# Patient Record
Sex: Female | Born: 1965
Health system: Southern US, Community
[De-identification: ages and names within clinical notes are randomized; demographics above are authoritative.]

## PROBLEM LIST (undated history)

## (undated) DIAGNOSIS — H269 Unspecified cataract: Secondary | ICD-10-CM

## (undated) DIAGNOSIS — Z808 Family history of malignant neoplasm of other organs or systems: Secondary | ICD-10-CM

## (undated) DIAGNOSIS — Z8051 Family history of malignant neoplasm of kidney: Secondary | ICD-10-CM

## (undated) DIAGNOSIS — Z8052 Family history of malignant neoplasm of bladder: Secondary | ICD-10-CM

## (undated) DIAGNOSIS — R51 Headache: Secondary | ICD-10-CM

## (undated) DIAGNOSIS — M1990 Unspecified osteoarthritis, unspecified site: Secondary | ICD-10-CM

## (undated) DIAGNOSIS — D649 Anemia, unspecified: Secondary | ICD-10-CM

## (undated) DIAGNOSIS — R011 Cardiac murmur, unspecified: Secondary | ICD-10-CM

## (undated) DIAGNOSIS — Z806 Family history of leukemia: Secondary | ICD-10-CM

## (undated) DIAGNOSIS — Z803 Family history of malignant neoplasm of breast: Secondary | ICD-10-CM

## (undated) HISTORY — DX: Family history of malignant neoplasm of kidney: Z80.51

## (undated) HISTORY — DX: Family history of malignant neoplasm of breast: Z80.3

## (undated) HISTORY — PX: MOUTH SURGERY: SHX715

## (undated) HISTORY — DX: Cardiac murmur, unspecified: R01.1

## (undated) HISTORY — PX: TOTAL ABDOMINAL HYSTERECTOMY: SHX209

## (undated) HISTORY — DX: Family history of leukemia: Z80.6

## (undated) HISTORY — DX: Unspecified osteoarthritis, unspecified site: M19.90

## (undated) HISTORY — DX: Family history of malignant neoplasm of other organs or systems: Z80.8

## (undated) HISTORY — DX: Unspecified cataract: H26.9

## (undated) HISTORY — DX: Family history of malignant neoplasm of bladder: Z80.52

## (undated) HISTORY — PX: COLON SURGERY: SHX602

---

## 1995-10-15 HISTORY — PX: COLON RESECTION: SHX5231

## 2006-07-16 ENCOUNTER — Other Ambulatory Visit: Admission: RE | Admit: 2006-07-16 | Discharge: 2006-07-16 | Payer: Self-pay | Admitting: Family Medicine

## 2008-04-29 ENCOUNTER — Ambulatory Visit: Payer: Self-pay | Admitting: Sports Medicine

## 2008-04-29 DIAGNOSIS — M79609 Pain in unspecified limb: Secondary | ICD-10-CM

## 2008-04-29 DIAGNOSIS — M722 Plantar fascial fibromatosis: Secondary | ICD-10-CM

## 2008-06-10 ENCOUNTER — Telehealth: Payer: Self-pay | Admitting: Sports Medicine

## 2008-06-13 ENCOUNTER — Other Ambulatory Visit: Admission: RE | Admit: 2008-06-13 | Discharge: 2008-06-13 | Payer: Self-pay | Admitting: Family Medicine

## 2008-07-28 ENCOUNTER — Ambulatory Visit: Payer: Self-pay

## 2008-08-03 ENCOUNTER — Ambulatory Visit: Payer: Self-pay | Admitting: Sports Medicine

## 2008-08-03 DIAGNOSIS — M25569 Pain in unspecified knee: Secondary | ICD-10-CM

## 2008-08-03 DIAGNOSIS — M7742 Metatarsalgia, left foot: Secondary | ICD-10-CM

## 2008-09-13 ENCOUNTER — Ambulatory Visit: Payer: Self-pay | Admitting: Sports Medicine

## 2008-11-15 ENCOUNTER — Encounter: Admission: RE | Admit: 2008-11-15 | Discharge: 2008-11-15 | Payer: Self-pay | Admitting: Family Medicine

## 2009-04-10 ENCOUNTER — Emergency Department (HOSPITAL_COMMUNITY): Admission: EM | Admit: 2009-04-10 | Discharge: 2009-04-10 | Payer: Self-pay | Admitting: Family Medicine

## 2013-02-11 ENCOUNTER — Other Ambulatory Visit (HOSPITAL_COMMUNITY)
Admission: RE | Admit: 2013-02-11 | Discharge: 2013-02-11 | Disposition: A | Discharge status: Home / Self Care | Payer: 59 | Source: Ambulatory Visit | Attending: Internal Medicine | Admitting: Internal Medicine

## 2013-02-11 ENCOUNTER — Other Ambulatory Visit: Payer: Self-pay | Admitting: Internal Medicine

## 2013-02-11 DIAGNOSIS — Z01419 Encounter for gynecological examination (general) (routine) without abnormal findings: Secondary | ICD-10-CM | POA: Insufficient documentation

## 2013-02-11 DIAGNOSIS — N852 Hypertrophy of uterus: Secondary | ICD-10-CM

## 2013-02-11 DIAGNOSIS — Z1151 Encounter for screening for human papillomavirus (HPV): Secondary | ICD-10-CM | POA: Insufficient documentation

## 2013-02-12 ENCOUNTER — Ambulatory Visit
Admission: RE | Admit: 2013-02-12 | Discharge: 2013-02-12 | Disposition: A | Discharge status: Home / Self Care | Payer: 59 | Source: Ambulatory Visit | Attending: Internal Medicine | Admitting: Internal Medicine

## 2013-02-12 DIAGNOSIS — N852 Hypertrophy of uterus: Secondary | ICD-10-CM

## 2013-04-13 NOTE — H&P (Signed)
Denise Smith  DICTATION # [id number] CSN# [id number]   Meriel Pica, MD 04/13/2013 2:04 PM

## 2013-04-14 NOTE — H&P (Signed)
NAMEJOEANN, Smith             ACCOUNT NO.:  [id number]  MEDICAL RECORD NO.:  [id number]  LOCATION:                                 FACILITY:  PHYSICIAN:  Duke Salvia. Marcelle Overlie, M.D.DATE OF BIRTH:  08/05/1966  DATE OF ADMISSION:  05/06/2013 DATE OF DISCHARGE:                             HISTORY & PHYSICAL   CHIEF COMPLAINT:  Pelvic pain, leiomyoma.  HISTORY OF PRESENT ILLNESS:  A 47 year old G0, P0, this patient is getting engaged to be married soon, was seen by Dr.  Merri Brunette for general medical exam in May of this year, felt to have uterine enlargement so ultrasound was ordered since she had complaints of pelvic pain and mild anemia.  Ultrasound at [facility] showed a uterus that was enlarged with multiple fibroids, including a 5.8-cm midline fundal fibroid and an exophytic right subserosal fibroid that was 6 cm.  Adnexa appeared to be unremarkable.  No free fluid was noted. Her hemoglobin was 11.8 on his screening.  My initial evaluation due to problems she has had with menorrhagia and pelvic pain along with increased pressure.  She requested definitive hysterectomy.  Does not plan to ever get pregnant.  The procedure of hysterectomy including specific risks related to bleeding, infection, transfusion, adjacent organ injury, wound infection, phlebitis, along with her expected recovery time discussed which she understands and accepts.  Of note, she had a prior midline incision for laparotomy with low anterior resection with primary reanastomosis and rectal plasty in 1995 that was done in [place].  CURRENT MEDICATIONS:  Iron, Imitrex p.r.n.  PAST SURGICAL HISTORY:  Low anterior resection with rectal plasty at [facility] in 1995.  REVIEW OF SYSTEMS:  Significant for history of migraine, mild anemia.  FAMILY HISTORY:  Significant for migraine headaches.  Father had an MI and ulcer disease.  Sister and mother both with a history of  anemia. Mother with osteoporosis.  Father with diverticulosis and arthritis, along with diabetes and history of kidney cancer in her father.  Also mother with history of breast cancer.  SOCIAL HISTORY:  Denies alcohol, tobacco, or drug use.  She is married, going on her honeymoon prior to surgery.  PHYSICAL EXAMINATION:  VITAL SIGNS:  Temperature 98.2, blood pressure 110/80. HEENT:  Unremarkable. NECK:  Supple without masses. LUNGS:  Clear. CARDIOVASCULAR:  Regular rate and rhythm.  No murmurs, rubs, gallops. Breasts without masses. ABDOMEN:  Soft, flat.  Fibroids noted halfway between the fundus and umbilicus.  There is a well-healed midline incision.  Vagina and cervix were normal.  Uterus was 15-16 week size.  Adnexa unremarkable. EXTREMITIES:  Unremarkable. NEUROLOGIC:  Unremarkable.  IMPRESSION:  Symptomatic leiomyoma.  PLAN:  TAH, bilateral salpingectomy, which was discussed as a way to possibly reduce later life risk of ovarian cancer.  Procedure and risks discussed as above.  We will also schedule a preoperative bowel prep.     Jacqulynn Shappell M. Marcelle Overlie, M.D.     RMH/MEDQ  D:  04/13/2013  T:  04/14/2013  Job:  [id number]

## 2013-04-22 ENCOUNTER — Encounter (HOSPITAL_COMMUNITY)
Admission: RE | Admit: 2013-04-22 | Discharge: 2013-04-22 | Disposition: A | Discharge status: Home / Self Care | Payer: PPO | Source: Ambulatory Visit | Attending: Obstetrics and Gynecology | Admitting: Obstetrics and Gynecology

## 2013-04-22 ENCOUNTER — Encounter (HOSPITAL_COMMUNITY): Payer: Self-pay

## 2013-04-22 DIAGNOSIS — Z01818 Encounter for other preprocedural examination: Secondary | ICD-10-CM | POA: Insufficient documentation

## 2013-04-22 DIAGNOSIS — Z01812 Encounter for preprocedural laboratory examination: Secondary | ICD-10-CM | POA: Insufficient documentation

## 2013-04-22 HISTORY — DX: Headache: R51

## 2013-04-22 HISTORY — DX: Anemia, unspecified: D64.9

## 2013-04-22 LAB — CBC
HCT: 35.3 % — ABNORMAL LOW (ref 36.0–46.0)
MCHC: 31.4 g/dL (ref 30.0–36.0)
MCV: 82.7 fL (ref 78.0–100.0)
RDW: 13.8 % (ref 11.5–15.5)

## 2013-04-22 NOTE — Patient Instructions (Addendum)
Your procedure is scheduled on:05/06/13  Enter through the Main Entrance at :6am Pick up desk phone and dial [phone] and inform us of your arrival.  Please call [phone] if you have any problems the morning of surgery.  Remember: Do not eat food or drink liquids, including water, after midnight: WED.   You may brush your teeth the morning of surgery.  Take these meds the morning of surgery with a sip of water:Imitrex if needed  DO NOT wear jewelry, eye make-up, lipstick,body lotion, or dark fingernail polish.  (Polished toes are ok) You may wear deodorant.  If you are to be admitted after surgery, leave suitcase in car until your room has been assigned. Patients discharged on the day of surgery will not be allowed to drive home. Wear loose fitting, comfortable clothes for your ride home.

## 2013-05-06 ENCOUNTER — Inpatient Hospital Stay (HOSPITAL_COMMUNITY)
Admission: RE | Admit: 2013-05-06 | Discharge: 2013-05-08 | DRG: 743 | Disposition: A | Discharge status: Home / Self Care | Payer: PPO | Source: Ambulatory Visit | Attending: Obstetrics and Gynecology | Admitting: Obstetrics and Gynecology

## 2013-05-06 ENCOUNTER — Inpatient Hospital Stay (HOSPITAL_COMMUNITY): Payer: PPO | Admitting: Anesthesiology

## 2013-05-06 ENCOUNTER — Encounter (HOSPITAL_COMMUNITY): Payer: Self-pay | Admitting: *Deleted

## 2013-05-06 ENCOUNTER — Encounter (HOSPITAL_COMMUNITY)
Admission: RE | Disposition: A | Discharge status: Home / Self Care | Payer: Self-pay | Source: Ambulatory Visit | Attending: Obstetrics and Gynecology

## 2013-05-06 ENCOUNTER — Encounter (HOSPITAL_COMMUNITY): Payer: Self-pay | Admitting: Anesthesiology

## 2013-05-06 DIAGNOSIS — D252 Subserosal leiomyoma of uterus: Principal | ICD-10-CM | POA: Diagnosis present

## 2013-05-06 DIAGNOSIS — D259 Leiomyoma of uterus, unspecified: Secondary | ICD-10-CM

## 2013-05-06 HISTORY — PX: ABDOMINAL HYSTERECTOMY: SHX81

## 2013-05-06 HISTORY — PX: LYSIS OF ADHESION: SHX5961

## 2013-05-06 SURGERY — HYSTERECTOMY, ABDOMINAL
Anesthesia: General | Site: Abdomen | Wound class: Clean Contaminated

## 2013-05-06 MED ORDER — KETOROLAC TROMETHAMINE 30 MG/ML IJ SOLN
30.0000 mg | Freq: Four times a day (QID) | INTRAMUSCULAR | Status: DC
  Administered 2013-05-06 – 2013-05-07 (×3): 30 mg via INTRAVENOUS
  Filled 2013-05-06 (×2): qty 1

## 2013-05-06 MED ORDER — HYDROMORPHONE HCL PF 1 MG/ML IJ SOLN: 0.2500 mg | INTRAMUSCULAR | Status: DC | PRN

## 2013-05-06 MED ORDER — ONDANSETRON HCL 4 MG PO TABS: 4.0000 mg | ORAL_TABLET | Freq: Four times a day (QID) | ORAL | Status: DC | PRN

## 2013-05-06 MED ORDER — PROPOFOL 10 MG/ML IV EMUL
INTRAVENOUS | Status: AC
  Filled 2013-05-06: qty 20

## 2013-05-06 MED ORDER — ONDANSETRON HCL 4 MG/2ML IJ SOLN
INTRAMUSCULAR | Status: DC | PRN
  Administered 2013-05-06: 4 mg via INTRAVENOUS

## 2013-05-06 MED ORDER — KETOROLAC TROMETHAMINE 30 MG/ML IJ SOLN
30.0000 mg | Freq: Four times a day (QID) | INTRAMUSCULAR | Status: DC
  Filled 2013-05-06: qty 1

## 2013-05-06 MED ORDER — KETOROLAC TROMETHAMINE 30 MG/ML IJ SOLN: 15.0000 mg | Freq: Once | INTRAMUSCULAR | Status: DC | PRN

## 2013-05-06 MED ORDER — KETOROLAC TROMETHAMINE 30 MG/ML IJ SOLN: 30.0000 mg | Freq: Once | INTRAMUSCULAR | Status: DC

## 2013-05-06 MED ORDER — MENTHOL 3 MG MT LOZG
1.0000 | LOZENGE | OROMUCOSAL | Status: DC | PRN
  Administered 2013-05-08: 3 mg via ORAL
  Filled 2013-05-06: qty 9

## 2013-05-06 MED ORDER — ZOLPIDEM TARTRATE 5 MG PO TABS: 5.0000 mg | ORAL_TABLET | Freq: Every evening | ORAL | Status: DC | PRN

## 2013-05-06 MED ORDER — PROMETHAZINE HCL 25 MG/ML IJ SOLN
INTRAMUSCULAR | Status: AC
  Administered 2013-05-06: 6.25 mg via INTRAVENOUS
  Filled 2013-05-06: qty 1

## 2013-05-06 MED ORDER — DIPHENHYDRAMINE HCL 12.5 MG/5ML PO ELIX: 12.5000 mg | ORAL_SOLUTION | Freq: Four times a day (QID) | ORAL | Status: DC | PRN

## 2013-05-06 MED ORDER — ROCURONIUM BROMIDE 100 MG/10ML IV SOLN
INTRAVENOUS | Status: DC | PRN
  Administered 2013-05-06: 40 mg via INTRAVENOUS

## 2013-05-06 MED ORDER — EPHEDRINE 5 MG/ML INJ
INTRAVENOUS | Status: AC
  Filled 2013-05-06: qty 10

## 2013-05-06 MED ORDER — ONDANSETRON HCL 4 MG/2ML IJ SOLN
INTRAMUSCULAR | Status: AC
  Filled 2013-05-06: qty 2

## 2013-05-06 MED ORDER — EPHEDRINE SULFATE 50 MG/ML IJ SOLN
INTRAMUSCULAR | Status: DC | PRN
  Administered 2013-05-06: 10 mg via INTRAVENOUS
  Administered 2013-05-06: 5 mg via INTRAVENOUS

## 2013-05-06 MED ORDER — SUMATRIPTAN SUCCINATE 25 MG PO TABS
25.0000 mg | ORAL_TABLET | ORAL | Status: DC | PRN
  Filled 2013-05-06: qty 1

## 2013-05-06 MED ORDER — ONDANSETRON HCL 4 MG/2ML IJ SOLN: 4.0000 mg | Freq: Four times a day (QID) | INTRAMUSCULAR | Status: DC | PRN

## 2013-05-06 MED ORDER — NALOXONE HCL 0.4 MG/ML IJ SOLN: 0.4000 mg | INTRAMUSCULAR | Status: DC | PRN

## 2013-05-06 MED ORDER — FENTANYL CITRATE 0.05 MG/ML IJ SOLN
INTRAMUSCULAR | Status: DC | PRN
  Administered 2013-05-06 (×5): 50 ug via INTRAVENOUS

## 2013-05-06 MED ORDER — LACTATED RINGERS IV SOLN
INTRAVENOUS | Status: DC
  Administered 2013-05-06 (×2): via INTRAVENOUS
  Administered 2013-05-06: 1000 mL via INTRAVENOUS

## 2013-05-06 MED ORDER — ROCURONIUM BROMIDE 50 MG/5ML IV SOLN
INTRAVENOUS | Status: AC
  Filled 2013-05-06: qty 1

## 2013-05-06 MED ORDER — OXYCODONE-ACETAMINOPHEN 5-325 MG PO TABS
1.0000 | ORAL_TABLET | ORAL | Status: DC | PRN
  Administered 2013-05-07: 2 via ORAL
  Administered 2013-05-07 (×2): 1 via ORAL
  Administered 2013-05-08: 2 via ORAL
  Filled 2013-05-06: qty 2
  Filled 2013-05-06: qty 1
  Filled 2013-05-06: qty 2
  Filled 2013-05-06: qty 1

## 2013-05-06 MED ORDER — SODIUM CHLORIDE 0.9 % IJ SOLN: 9.0000 mL | INTRAMUSCULAR | Status: DC | PRN

## 2013-05-06 MED ORDER — IBUPROFEN 800 MG PO TABS
800.0000 mg | ORAL_TABLET | Freq: Three times a day (TID) | ORAL | Status: DC | PRN
  Administered 2013-05-07 – 2013-05-08 (×2): 800 mg via ORAL
  Filled 2013-05-06 (×2): qty 1

## 2013-05-06 MED ORDER — MORPHINE SULFATE (PF) 1 MG/ML IV SOLN
INTRAVENOUS | Status: DC
  Administered 2013-05-06: 4 mg via INTRAVENOUS
  Administered 2013-05-06: 5 mg via INTRAVENOUS
  Administered 2013-05-06: 13 mg via INTRAVENOUS
  Administered 2013-05-06: 11:00:00 via INTRAVENOUS
  Administered 2013-05-07: 2 mg via INTRAVENOUS
  Administered 2013-05-07: 1 mg via INTRAVENOUS
  Filled 2013-05-06: qty 25

## 2013-05-06 MED ORDER — CEFAZOLIN SODIUM-DEXTROSE 2-3 GM-% IV SOLR
2.0000 g | INTRAVENOUS | Status: AC
  Administered 2013-05-06: 2 g via INTRAVENOUS

## 2013-05-06 MED ORDER — DEXTROSE IN LACTATED RINGERS 5 % IV SOLN
INTRAVENOUS | Status: DC
  Administered 2013-05-06 – 2013-05-07 (×2): via INTRAVENOUS

## 2013-05-06 MED ORDER — MIDAZOLAM HCL 5 MG/5ML IJ SOLN
INTRAMUSCULAR | Status: DC | PRN
  Administered 2013-05-06: 2 mg via INTRAVENOUS

## 2013-05-06 MED ORDER — PROMETHAZINE HCL 25 MG/ML IJ SOLN: 6.2500 mg | INTRAMUSCULAR | Status: DC | PRN

## 2013-05-06 MED ORDER — BUPIVACAINE LIPOSOME 1.3 % IJ SUSP
INTRAMUSCULAR | Status: DC | PRN
  Administered 2013-05-06: 20 mL

## 2013-05-06 MED ORDER — SODIUM CHLORIDE 0.9 % IJ SOLN
INTRAMUSCULAR | Status: DC | PRN
  Administered 2013-05-06: 20 mL

## 2013-05-06 MED ORDER — MIDAZOLAM HCL 2 MG/2ML IJ SOLN
INTRAMUSCULAR | Status: AC
  Filled 2013-05-06: qty 2

## 2013-05-06 MED ORDER — FENTANYL CITRATE 0.05 MG/ML IJ SOLN
INTRAMUSCULAR | Status: AC
  Filled 2013-05-06: qty 5

## 2013-05-06 MED ORDER — HYDROMORPHONE HCL PF 1 MG/ML IJ SOLN
INTRAMUSCULAR | Status: AC
  Administered 2013-05-06: 0.25 mg via INTRAVENOUS
  Filled 2013-05-06: qty 1

## 2013-05-06 MED ORDER — DEXAMETHASONE SODIUM PHOSPHATE 10 MG/ML IJ SOLN
INTRAMUSCULAR | Status: AC
  Filled 2013-05-06: qty 1

## 2013-05-06 MED ORDER — LIDOCAINE HCL (CARDIAC) 20 MG/ML IV SOLN
INTRAVENOUS | Status: AC
  Filled 2013-05-06: qty 5

## 2013-05-06 MED ORDER — DIPHENHYDRAMINE HCL 50 MG/ML IJ SOLN: 12.5000 mg | Freq: Four times a day (QID) | INTRAMUSCULAR | Status: DC | PRN

## 2013-05-06 MED ORDER — CEFAZOLIN SODIUM-DEXTROSE 2-3 GM-% IV SOLR
INTRAVENOUS | Status: AC
  Filled 2013-05-06: qty 50

## 2013-05-06 MED ORDER — BUTORPHANOL TARTRATE 1 MG/ML IJ SOLN: 1.0000 mg | INTRAMUSCULAR | Status: DC | PRN

## 2013-05-06 MED ORDER — ACETAMINOPHEN 160 MG/5ML PO SOLN
ORAL | Status: AC
  Filled 2013-05-06: qty 20.3

## 2013-05-06 MED ORDER — PROPOFOL 10 MG/ML IV BOLUS
INTRAVENOUS | Status: DC | PRN
  Administered 2013-05-06: 170 mg via INTRAVENOUS

## 2013-05-06 MED ORDER — GLYCOPYRROLATE 0.2 MG/ML IJ SOLN
INTRAMUSCULAR | Status: DC | PRN
  Administered 2013-05-06: .5 mg via INTRAVENOUS

## 2013-05-06 MED ORDER — DEXAMETHASONE SODIUM PHOSPHATE 10 MG/ML IJ SOLN
INTRAMUSCULAR | Status: DC | PRN
  Administered 2013-05-06: 10 mg via INTRAVENOUS

## 2013-05-06 MED ORDER — LIDOCAINE HCL (CARDIAC) 20 MG/ML IV SOLN
INTRAVENOUS | Status: DC | PRN
  Administered 2013-05-06: 100 mg via INTRAVENOUS

## 2013-05-06 MED ORDER — ACETAMINOPHEN 10 MG/ML IV SOLN
1000.0000 mg | Freq: Once | INTRAVENOUS | Status: AC
  Filled 2013-05-06: qty 100

## 2013-05-06 MED ORDER — NEOSTIGMINE METHYLSULFATE 1 MG/ML IJ SOLN
INTRAMUSCULAR | Status: DC | PRN
  Administered 2013-05-06: 3 mg via INTRAVENOUS

## 2013-05-06 MED ORDER — ACETAMINOPHEN 160 MG/5ML PO SOLN
975.0000 mg | Freq: Once | ORAL | Status: AC
  Administered 2013-05-06: 975 mg via ORAL

## 2013-05-06 MED ORDER — BUPIVACAINE LIPOSOME 1.3 % IJ SUSP
20.0000 mL | Freq: Once | INTRAMUSCULAR | Status: DC
  Filled 2013-05-06: qty 20

## 2013-05-06 SURGICAL SUPPLY — 46 items
CANISTER SUCTION 2500CC (MISCELLANEOUS) ×5 IMPLANT
CLOTH BEACON ORANGE TIMEOUT ST (SAFETY) ×3 IMPLANT
CONT PATH 16OZ SNAP LID 3702 (MISCELLANEOUS) ×3 IMPLANT
DECANTER SPIKE VIAL GLASS SM (MISCELLANEOUS) ×4 IMPLANT
DRSG OPSITE POSTOP 4X10 (GAUZE/BANDAGES/DRESSINGS) ×2 IMPLANT
DRSG TEGADERM 2.38X2.75 (GAUZE/BANDAGES/DRESSINGS) ×2 IMPLANT
ELECT LIGASURE SHORT 9 REUSE (ELECTRODE) ×2 IMPLANT
GLOVE BIO SURGEON STRL SZ7 (GLOVE) ×7 IMPLANT
GLOVE BIO SURGEON STRL SZ8 (GLOVE) ×2 IMPLANT
GLOVE BIOGEL PI IND STRL 7.0 (GLOVE) ×4 IMPLANT
GLOVE BIOGEL PI INDICATOR 7.0 (GLOVE) ×4
GLOVE ECLIPSE 6.5 STRL STRAW (GLOVE) ×4 IMPLANT
GLOVE SURG SS PI 6.5 STRL IVOR (GLOVE) ×6 IMPLANT
GLOVE SURG SS PI 7.0 STRL IVOR (GLOVE) ×12 IMPLANT
GOWN PREVENTION PLUS LG XLONG (DISPOSABLE) ×9 IMPLANT
GOWN STRL REIN XL XLG (GOWN DISPOSABLE) ×6 IMPLANT
NDL HYPO 18GX1.5 BLUNT FILL (NEEDLE) IMPLANT
NEEDLE HYPO 18GX1.5 BLUNT FILL (NEEDLE) ×3 IMPLANT
NEEDLE HYPO 22GX1.5 SAFETY (NEEDLE) ×2 IMPLANT
NS IRRIG 1000ML POUR BTL (IV SOLUTION) ×5 IMPLANT
PACK ABDOMINAL GYN (CUSTOM PROCEDURE TRAY) ×3 IMPLANT
PAD OB MATERNITY 4.3X12.25 (PERSONAL CARE ITEMS) ×3 IMPLANT
PROTECTOR NERVE ULNAR (MISCELLANEOUS) ×3 IMPLANT
SPONGE LAP 18X18 X RAY DECT (DISPOSABLE) ×8 IMPLANT
STAPLER VISISTAT 35W (STAPLE) ×2 IMPLANT
SUT MON AB 2-0 CT1 36 (SUTURE) ×3 IMPLANT
SUT MON AB 4-0 PS1 27 (SUTURE) ×3 IMPLANT
SUT PDS AB 0 CT1 27 (SUTURE) ×6 IMPLANT
SUT PDS AB 0 CTX 60 (SUTURE) ×4 IMPLANT
SUT PDS AB 1 CT  36 (SUTURE) ×2
SUT PDS AB 1 CT 36 (SUTURE) ×2 IMPLANT
SUT VIC AB 0 CT1 18XCR BRD8 (SUTURE) ×4 IMPLANT
SUT VIC AB 0 CT1 27 (SUTURE) ×3
SUT VIC AB 0 CT1 27XBRD ANBCTR (SUTURE) ×2 IMPLANT
SUT VIC AB 0 CT1 8-18 (SUTURE) ×6
SUT VIC AB 2-0 CT1 27 (SUTURE) ×3
SUT VIC AB 2-0 CT1 TAPERPNT 27 (SUTURE) ×1 IMPLANT
SUT VIC AB 3-0 CT1 27 (SUTURE) ×3
SUT VIC AB 3-0 CT1 TAPERPNT 27 (SUTURE) ×2 IMPLANT
SUT VICRYL 0 TIES 12 18 (SUTURE) ×3 IMPLANT
SYR CONTROL 10ML LL (SYRINGE) ×2 IMPLANT
SYR TB 1ML 27GX1/2 SAFE (SYRINGE) ×1 IMPLANT
SYR TB 1ML 27GX1/2 SAFETY (SYRINGE) ×3
TOWEL OR 17X24 6PK STRL BLUE (TOWEL DISPOSABLE) ×8 IMPLANT
TRAY FOLEY CATH 14FR (SET/KITS/TRAYS/PACK) ×3 IMPLANT
WATER STERILE IRR 1000ML POUR (IV SOLUTION) ×3 IMPLANT

## 2013-05-06 NOTE — Op Note (Signed)
Preoperative diagnosis: Symptomatic leiomyoma  Postoperative diagnosis: Same  Procedure: TAH, lysis of adhesions  Surgeon: Marcelle Overlie  Assistant: Morris  EBL: 200 cc  Specimens removed: Uterus, to pathology  Drains: Foley catheter  Complications: None  Procedure and findings:  The patient taken the operating room after an adequate level of general anesthesia was obtained with the patient supine the abdomen prepped and draped in the usual fashion for abdominal procedures. Vagina was prepped separately and Foley catheter positioned. Appropriate timeout taken at that point. After prepping and draping, the prior midline incision scar was excised in the lips this is carried down to the fascia vertically, extended with scissors. The peritoneum was entered superiorly without difficulty. The incision and peritoneum was extended vertically O'Connor-O'Sullivan retractor was positioned bowel Speck sparely out of the field up to the patient placed in Trendelenburg. Pelvic findings as follows  Uterus itself was enlarged to 15 weeks size with multiple irregular fibroids. I could never identify left tube and ovary, this may have atrophied in her prior procedure which was a low anterior resection with rectoplasty years ago. There was moderate scarring around her right ovary portion the right tube was not readily identified either. The posterior cul-de-sac was relatively free and clear. Starting on the left the round ligament was clamped divided and free tie with 0 Vicryl suture. The peritoneum carried around to the midline anteriorly the exact same repeated on the opposite side the utero-ovarian pedicle on the left was clamped divided and suture ligated with 0 Vicryl. The bladder flap was developed with sharp and blunt dissection making sure that the bladder was bluntly cervical vaginal junction. Further dissection on the left close to the uterus with a clamp cut and suture technique to ligate the ascending branch  of the uterine artery on the left side. Attention directed to the right side the right utero-ovarian pedicle was doubly clamped right round ligament clamped divided and suture ligated and the posterior leaf of the broad ligament was perforated with the surgeon's finger, the right utero-ovarian pedicle was clamped divided first free tie followed by suture ligature of Vicryl. Thus the right tube and ovary were conserved per patient request. In sequential manner the uterine vasculature pedicles the right side was skeletonized clamped divided and suture ligated with 0 Vicryl. After assuring that the bladder was well below and that the colon was not adherent to the posterior uterine wall, sequential dissection of the cardinal ligament, uterosacral ligament and cervical vaginal pedicles back and clamp cut tie technique. The fundus and cervix were then removed cervical vaginal pedicles were held and vaginal cuff closed with interrupted 2-0 Vicryl sutures. The pelvis is irrigated with saline and be hemostatic. Right salpingectomy was not performed since this was fairly intimate to the right ovary which was moderately adherent to the right pelvic sidewall from her prior surgery. Prior to closure sponge denies precast approach correct x2 peritoneum was enclose a running 2-0 Vicryl suture. 2-0 Vicryl interrupted sutures used to reapproximate the rectus muscles in the midline. A looped 0 PDS suture was then used to close the fascia vertically subcutaneous tissue was undermined to reduce tension this is made hemostatic with the Bovie 50-50 solution of saline intact Perl was injected into the subcutaneous tissue for postop pain relief. Clips used on the skin sterile dressing applied clear urine noted in the case she tolerated this well went to recovery room in good condition.  Dictated with [facility]  Denise Smith M. Denise Smith.D.

## 2013-05-06 NOTE — Progress Notes (Signed)
The patient was re-examined with no change in status 

## 2013-05-06 NOTE — Anesthesia Postprocedure Evaluation (Signed)
Anesthesia Post Note  Patient: Denise Smith  Procedure(s) Performed: Procedure(s) (LRB): TOTAL ABDOMINAL HYSTERECTOMY (N/A) LYSIS OF ADHESION (N/A)  Anesthesia type: General  Patient location: PACU  Post pain: Pain level controlled  Post assessment: Post-op Vital signs reviewed  Last Vitals:  Filed Vitals:   05/06/13 0852  BP: 92/46  Pulse: 49  Temp: 36.6 C  Resp: 9    Post vital signs: Reviewed  Level of consciousness: sedated  Complications: No apparent anesthesia complications

## 2013-05-06 NOTE — Transfer of Care (Signed)
Immediate Anesthesia Transfer of Care Note  Patient: Denise Smith  Procedure(s) Performed: Procedure(s): TOTAL ABDOMINAL HYSTERECTOMY (N/A) LYSIS OF ADHESION (N/A)  Patient Location: PACU  Anesthesia Type:General  Level of Consciousness: awake, alert  and oriented  Airway & Oxygen Therapy: Patient Spontanous Breathing and Patient connected to nasal cannula oxygen  Post-op Assessment: Report given to PACU RN, Post -op Vital signs reviewed and stable and Patient moving all extremities  Post vital signs: Reviewed and stable  Complications: No apparent anesthesia complications

## 2013-05-06 NOTE — Progress Notes (Signed)
Alert + conversant,, VSS, adeq UOP>>clear, wants cath out this PM

## 2013-05-06 NOTE — Anesthesia Postprocedure Evaluation (Signed)
  Anesthesia Post-op Note  Anesthesia Post Note  Patient: Denise Smith  Procedure(s) Performed: Procedure(s) (LRB): TOTAL ABDOMINAL HYSTERECTOMY (N/A) LYSIS OF ADHESION (N/A)  Anesthesia type: General  Patient location: Women's Unit  Post pain: Pain level controlled  Post assessment: Post-op Vital signs reviewed  Last Vitals:  Filed Vitals:   05/06/13 1330  BP: 119/70  Pulse: 58  Temp: 36.8 C  Resp: 16    Post vital signs: Reviewed  Level of consciousness: sedated  Complications: No apparent anesthesia complications

## 2013-05-06 NOTE — Anesthesia Preprocedure Evaluation (Signed)
Anesthesia Evaluation  Patient identified by MRN, date of birth, ID band Patient awake    Reviewed: Allergy & Precautions, H&P , NPO status , Patient's Chart, lab work & pertinent test results, reviewed documented beta blocker date and time   History of Anesthesia Complications Negative for: history of anesthetic complications  Airway Mallampati: I TM Distance: >3 FB Neck ROM: full    Dental  (+) Teeth Intact   Pulmonary neg pulmonary ROS,  breath sounds clear to auscultation  Pulmonary exam normal       Cardiovascular negative cardio ROS  Rhythm:regular Rate:Normal + Systolic murmurs    Neuro/Psych  Headaches (migraines 1-2x/month), negative psych ROS   GI/Hepatic negative GI ROS, Neg liver ROS,   Endo/Other  negative endocrine ROS  Renal/GU negative Renal ROS  Female GU complaint     Musculoskeletal   Abdominal   Peds  Hematology  (+) anemia ,   Anesthesia Other Findings   Reproductive/Obstetrics negative OB ROS                           Anesthesia Physical Anesthesia Plan  ASA: II  Anesthesia Plan: General ETT   Post-op Pain Management:    Induction:   Airway Management Planned:   Additional Equipment:   Intra-op Plan:   Post-operative Plan:   Informed Consent: I have reviewed the patients History and Physical, chart, labs and discussed the procedure including the risks, benefits and alternatives for the proposed anesthesia with the patient or authorized representative who has indicated his/her understanding and acceptance.   Dental Advisory Given  Plan Discussed with: CRNA and Surgeon  Anesthesia Plan Comments:         Anesthesia Quick Evaluation

## 2013-05-06 NOTE — Preoperative (Signed)
Beta Blockers   Reason not to administer Beta Blockers:Not Applicable 

## 2013-05-07 ENCOUNTER — Encounter (HOSPITAL_COMMUNITY): Payer: Self-pay | Admitting: Obstetrics and Gynecology

## 2013-05-07 LAB — CBC
Hemoglobin: 8.8 g/dL — ABNORMAL LOW (ref 12.0–15.0)
MCH: 26.1 pg (ref 26.0–34.0)
MCHC: 32 g/dL (ref 30.0–36.0)
RDW: 13.7 % (ref 11.5–15.5)

## 2013-05-07 MED ORDER — BISACODYL 10 MG RE SUPP: 10.0000 mg | Freq: Every day | RECTAL | Status: DC | PRN

## 2013-05-07 NOTE — Progress Notes (Signed)
1 Day Post-Op Procedure(s) (LRB): TOTAL ABDOMINAL HYSTERECTOMY (N/A) LYSIS OF ADHESION (N/A)  Subjective: Patient reports tolerating PO and no problems voiding.    Objective: I have reviewed patient's vital signs and labs. BP 119/60  Pulse 56  Temp(Src) 98 F (36.7 C) (Oral)  Resp 14  Ht 5\' 7"  (1.702 m)  Wt 175 lb (79.379 kg)  BMI 27.4 kg/m2  SpO2 100% CBC    Component Value Date/Time   WBC 9.0 05/07/2013 0525   RBC 3.37* 05/07/2013 0525   HGB 8.8* 05/07/2013 0525   HCT 27.5* 05/07/2013 0525   PLT 263 05/07/2013 0525   MCV 81.6 05/07/2013 0525   MCH 26.1 05/07/2013 0525   MCHC 32.0 05/07/2013 0525   RDW 13.7 05/07/2013 0525     abd soft, +BS, dressing dry  Assessment: s/p Procedure(s): TOTAL ABDOMINAL HYSTERECTOMY (N/A) LYSIS OF ADHESION (N/A): stable  Plan: Advance diet Encourage ambulation  LOS: 1 day    Shatima Zalar M 05/07/2013, 7:48 AM

## 2013-05-08 DIAGNOSIS — D259 Leiomyoma of uterus, unspecified: Secondary | ICD-10-CM

## 2013-05-08 MED ORDER — IBUPROFEN 200 MG PO TABS: 800.0000 mg | ORAL_TABLET | Freq: Three times a day (TID) | ORAL | Status: DC | PRN

## 2013-05-08 MED ORDER — OXYCODONE-ACETAMINOPHEN 5-325 MG PO TABS: 1.0000 | ORAL_TABLET | ORAL | Status: DC | PRN

## 2013-05-08 NOTE — Discharge Summary (Signed)
Physician Discharge Summary  Patient ID: Denise Smith MRN: [id number] DOB/AGE: 1965/11/10 47 y.o.  Admit date: 05/06/2013 Discharge date: 05/08/2013  Admission Diagnoses:  Discharge Diagnoses:  Active Problems:   Leiomyoma of uterus, unspecified   Discharged Condition: good  Hospital Course: adm for TAH for fibroiods, D/C POD # 2, tol PO, afeb, Inc C/D  Consults: None  Significant Diagnostic Studies: labs:  Results for orders placed during the hospital encounter of 05/06/13 (from the past 72 hour(s))  PREGNANCY, URINE     Status: None   Collection Time    05/06/13  6:00 AM      Result Value Range   Preg Test, Ur NEGATIVE  NEGATIVE   Comment:            THE SENSITIVITY OF THIS     METHODOLOGY IS >20 mIU/mL.  CBC     Status: Abnormal   Collection Time    05/07/13  5:25 AM      Result Value Range   WBC 9.0  4.0 - 10.5 K/uL   RBC 3.37 (*) 3.87 - 5.11 MIL/uL   Hemoglobin 8.8 (*) 12.0 - 15.0 g/dL   HCT 08.6 (*) 57.8 - 46.9 %   MCV 81.6  78.0 - 100.0 fL   MCH 26.1  26.0 - 34.0 pg   MCHC 32.0  30.0 - 36.0 g/dL   RDW 62.9  52.8 - 41.3 %   Platelets 263  150 - 400 K/uL    Treatments: surgery: TAH  Discharge Exam: Blood pressure 108/71, pulse 70, temperature 98.1 F (36.7 C), temperature source Oral, resp. rate 18, height 5\' 7"  (1.702 m), weight 175 lb (79.379 kg), SpO2 98.00%. Incision/Wound:clean /dry  Disposition: Final discharge disposition not confirmed     Medication List    STOP taking these medications       CALCITRIOL PO     CHLOROPHYLL PO     COQ10 PO     FISH OIL PO     L-CARNITINE PO     SELENIUM PO     VITAMIN D-3 PO      TAKE these medications       ibuprofen 200 MG tablet  Commonly known as:  ADVIL  Take 4 tablets (800 mg total) by mouth every 8 (eight) hours as needed for pain.     OVER THE COUNTER MEDICATION  Take 1 tablet by mouth daily. Black Raspberry Supplement     OVER THE COUNTER MEDICATION  Take 1 tablet by mouth daily.  Over the counter supplement IP6 for prevention of cancer     oxyCODONE-acetaminophen 5-325 MG per tablet  Commonly known as:  PERCOCET/ROXICET  Take 1-2 tablets by mouth every 4 (four) hours as needed.     SUMAtriptan 25 MG tablet  Commonly known as:  IMITREX  Take 25 mg by mouth every 2 (two) hours as needed for migraine.     VITAMIN E (TOPICAL) Oil  Apply 1 application topically daily.           Follow-up Information   Follow up with Meriel Pica, MD In 1 week. (office will call)    Contact information:   [address] [place] [address] [phone]       Signed: Meriel Pica 05/08/2013, 8:16 AM

## 2013-05-08 NOTE — Progress Notes (Signed)
2 Days Post-Op Procedure(s) (LRB): TOTAL ABDOMINAL HYSTERECTOMY (N/A) LYSIS OF ADHESION (N/A)  Subjective: Patient reports tolerating PO.    Objective: I have reviewed patient's vital signs and labs. BP 108/71  Pulse 70  Temp(Src) 98.1 F (36.7 C) (Oral)  Resp 18  Ht 5\' 7"  (1.702 m)  Wt 175 lb (79.379 kg)  BMI 27.4 kg/m2  SpO2 98%  General: alert Inc C/D,,+BS Assessment: s/p Procedure(s): TOTAL ABDOMINAL HYSTERECTOMY (N/A) LYSIS OF ADHESION (N/A): stable  Plan: Discharge home  LOS: 2 days    Lott Seelbach M 05/08/2013, 8:12 AM

## 2013-05-08 NOTE — Progress Notes (Signed)
Discharge instructions reviewed with patient.  Patient states understanding of home care, medications, activity, signs/symptoms to report to MD and return MD office visit.  No home equipment needed.  Patient ambulated for discharge in stable condition with staff without incident.  

## 2013-08-19 ENCOUNTER — Other Ambulatory Visit: Payer: Self-pay

## 2014-07-29 ENCOUNTER — Other Ambulatory Visit: Payer: Self-pay

## 2016-12-15 ENCOUNTER — Emergency Department (HOSPITAL_COMMUNITY)
Admission: EM | Admit: 2016-12-15 | Discharge: 2016-12-15 | Disposition: A | Discharge status: Home / Self Care | Payer: PPO | Attending: Emergency Medicine | Admitting: Emergency Medicine

## 2016-12-15 ENCOUNTER — Encounter (HOSPITAL_COMMUNITY): Payer: Self-pay | Admitting: Emergency Medicine

## 2016-12-15 ENCOUNTER — Emergency Department (HOSPITAL_COMMUNITY): Payer: PPO

## 2016-12-15 DIAGNOSIS — S66809A Unspecified injury of other specified muscles, fascia and tendons at wrist and hand level, unspecified hand, initial encounter: Secondary | ICD-10-CM

## 2016-12-15 DIAGNOSIS — S68119A Complete traumatic metacarpophalangeal amputation of unspecified finger, initial encounter: Secondary | ICD-10-CM

## 2016-12-15 DIAGNOSIS — Y999 Unspecified external cause status: Secondary | ICD-10-CM | POA: Insufficient documentation

## 2016-12-15 DIAGNOSIS — Z79899 Other long term (current) drug therapy: Secondary | ICD-10-CM | POA: Insufficient documentation

## 2016-12-15 DIAGNOSIS — S61351A Open bite of left index finger with damage to nail, initial encounter: Secondary | ICD-10-CM | POA: Insufficient documentation

## 2016-12-15 DIAGNOSIS — W540XXA Bitten by dog, initial encounter: Secondary | ICD-10-CM | POA: Insufficient documentation

## 2016-12-15 DIAGNOSIS — Z87891 Personal history of nicotine dependence: Secondary | ICD-10-CM | POA: Insufficient documentation

## 2016-12-15 DIAGNOSIS — Y929 Unspecified place or not applicable: Secondary | ICD-10-CM | POA: Insufficient documentation

## 2016-12-15 DIAGNOSIS — Y939 Activity, unspecified: Secondary | ICD-10-CM | POA: Insufficient documentation

## 2016-12-15 MED ORDER — LIDOCAINE HCL (PF) 1 % IJ SOLN
10.0000 mL | Freq: Once | INTRAMUSCULAR | Status: AC
  Administered 2016-12-15: 10 mL
  Filled 2016-12-15: qty 30

## 2016-12-15 MED ORDER — IBUPROFEN 200 MG PO TABS: 600.0000 mg | ORAL_TABLET | Freq: Four times a day (QID) | ORAL | Status: DC | PRN

## 2016-12-15 MED ORDER — OXYCODONE HCL 5 MG PO TABS: 5.0000 mg | ORAL_TABLET | Freq: Four times a day (QID) | ORAL | 0 refills | Status: DC | PRN

## 2016-12-15 MED ORDER — AMOXICILLIN-POT CLAVULANATE 875-125 MG PO TABS: 1.0000 | ORAL_TABLET | Freq: Two times a day (BID) | ORAL | 0 refills | Status: AC

## 2016-12-15 MED ORDER — FENTANYL CITRATE (PF) 100 MCG/2ML IJ SOLN
50.0000 ug | INTRAMUSCULAR | Status: DC | PRN
  Administered 2016-12-15 (×2): 50 ug via INTRAVENOUS
  Filled 2016-12-15 (×2): qty 2

## 2016-12-15 MED ORDER — ACETAMINOPHEN 325 MG PO TABS: 650.0000 mg | ORAL_TABLET | Freq: Four times a day (QID) | ORAL | Status: DC | PRN

## 2016-12-15 MED ORDER — SODIUM CHLORIDE 0.9 % IV SOLN
3.0000 g | Freq: Once | INTRAVENOUS | Status: AC
  Administered 2016-12-15: 3 g via INTRAVENOUS
  Filled 2016-12-15: qty 3

## 2016-12-15 MED ORDER — ONDANSETRON HCL 4 MG/2ML IJ SOLN
4.0000 mg | INTRAMUSCULAR | Status: DC | PRN
  Administered 2016-12-15: 4 mg via INTRAVENOUS
  Filled 2016-12-15: qty 2

## 2016-12-15 NOTE — Consult Note (Signed)
ORTHOPAEDIC CONSULTATION HISTORY & PHYSICAL REQUESTING PHYSICIAN: Leo Grosser, MD  Chief Complaint: Left index finger traumatic amputation  HPI: Denise Smith is a 51 y.o. female who presented to the emergency department following a dog attack, in which the tip of the left index finger was amputated traumatically, pulling with that the FDP tendon, separating from the muscle tendon junction.  The amputated portion is present.  The emergency department is assessing the situation for rabies, and it is my understanding that information regarding the dog is still being obtained to guide decision-making.  Tetanus is up-to-date.  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  Past Medical History:  Diagnosis Date  . Anemia   . [id number])    Past Surgical History:  Procedure Laterality Date  . ABDOMINAL HYSTERECTOMY N/A 05/06/2013   Procedure: TOTAL ABDOMINAL HYSTERECTOMY;  Surgeon: Margarette Asal, MD;  Location: [facility];  Service: Gynecology;  Laterality: N/A;  . COLON RESECTION  1997  . LYSIS OF ADHESION N/A 05/06/2013   Procedure: LYSIS OF ADHESION;  Surgeon: Margarette Asal, MD;  Location: [facility];  Service: Gynecology;  Laterality: N/A;  . MOUTH SURGERY     Social History   Social History  . Marital status: Married    Spouse name: N/A  . Number of children: N/A  . Years of education: N/A   Social History Main Topics  . Smoking status: Former Smoker    Quit date: 04/22/2008  . Smokeless tobacco: Never Used  . Alcohol use No  . Drug use: No  . Sexual activity: Not Asked   Other Topics Concern  . None   Social History Narrative  . None   No family history on file. No Known Allergies Prior to Admission medications   Medication Sig Start Date End Date Taking? Authorizing Provider  acetaminophen (TYLENOL) 325 MG tablet Take 2 tablets (650 mg total) by mouth every 6 (six) hours as needed for mild pain or moderate  pain. 12/15/16   Milly Jakob, MD  amoxicillin-clavulanate (AUGMENTIN) 875-125 MG tablet Take 1 tablet by mouth 2 (two) times daily. 12/15/16 12/22/16  Milly Jakob, MD  ibuprofen (ADVIL) 200 MG tablet Take 4 tablets (800 mg total) by mouth every 8 (eight) hours as needed for pain. 05/08/13   Molli Posey, MD  ibuprofen (ADVIL) 200 MG tablet Take 3 tablets (600 mg total) by mouth every 6 (six) hours as needed for mild pain or moderate pain. 12/15/16   Milly Jakob, MD  OVER THE COUNTER MEDICATION Take 1 tablet by mouth daily. Black Raspberry Supplement    Historical Provider, MD  OVER THE COUNTER MEDICATION Take 1 tablet by mouth daily. Over the counter supplement IP6 for prevention of cancer    Historical Provider, MD  oxyCODONE (ROXICODONE) 5 MG immediate release tablet Take 1 tablet (5 mg total) by mouth every 6 (six) hours as needed for severe pain. 12/15/16   Milly Jakob, MD  oxyCODONE-acetaminophen (PERCOCET/ROXICET) 5-325 MG per tablet Take 1-2 tablets by mouth every 4 (four) hours as needed. 05/08/13   Molli Posey, MD  SUMAtriptan (IMITREX) 25 MG tablet Take 25 mg by mouth every 2 (two) hours as needed for migraine.    Historical Provider, MD  VITAMIN E, TOPICAL, OIL Apply 1 application topically daily.    Historical Provider, MD   Dg Hand Complete Left  Result Date: 12/15/2016 CLINICAL DATA:  Finger amputation.  Dog attack.  Initial encounter. EXAM:  LEFT HAND - COMPLETE 3+ VIEW COMPARISON:  None. FINDINGS: There is amputation of the index finger at the level of the DIP joint. A 4 mm residual osseous fragment is present from the base of the amputated distal phalanx. The middle phalanx appears intact. No dislocation is seen. Bandage material is in place with soft tissue swelling noted. No fracture is identified elsewhere in the hand. IMPRESSION: Distal amputation of the index finger as above. Electronically Signed   By: Logan Bores M.D.   On: 12/15/2016 12:33    Positive ROS: All other  systems have been reviewed and were otherwise negative with the exception of those mentioned in the HPI and as above.  Physical Exam: Vitals: Refer to EMR. Constitutional:  WD, WN, NAD HEENT:  NCAT, EOMI Neuro/Psych:  Alert & oriented to person, place, and time; appropriate mood & affect Lymphatic: No generalized extremity edema or lymphadenopathy Extremities / MSK:  The extremities are normal with respect to appearance, ranges of motion, joint stability, muscle strength/tone, sensation, & perfusion except as otherwise noted:  Left index finger amputation stump is somewhat dirty, with some matted hair.  The evulsion is oblique, with the distal intact portion being dorsal and ulnar, and the shortest remaining coverage being volar and radial.  The condyles of the middle phalanx are exposed, as is a small remaining residual Ragnar from the base the distal phalanx.  For practicality, this is a DIP level amputation.  There is good preservation of subcutaneous tissue such that the flexor tendon sheath isn't really exposed.  Assessment: Left index fingertip avulsion thru the DIP joint  Plan: I discussed these findings with her and recommended treatment at the bedside.  She consented.  A digital block was performed by me.  Once appropriate anesthesia had been obtained, tourniquet was applied to the base of the digit and it was washed and scrubbed vigorously under the running water at the sink.  The same treatment was applied to the skin from the amputated part.  The stump was then prepped with Betadine, and using a scalpel, the remaining portion of distal phalanx was excised, the middle phalanx was divided with a bone cutter just proximal to the condyles, and the distal and smoothed.  The remaining volar plate was then sutured to the remaining extensor tendon to provide for additional padding over the remnant phalanx, and the dorsal ulnar flap was wrapped over the end is much as it could, and secured in  place with 6-0 chromic interrupted suture.  From the amputated part, skin was harvested and defatted.  Full-thickness skin graft was then trephinated, and applied to the volar radial aspect of the index fingertip which lacked dermal coverage.  It was secured in place with a combination of 4-0 nylon and 4-0 chromic sutures.  The 4-0 nylon sutures were then used to secure the bolster dressing of moistened gauze and Xeroform.  With the bolster dressing in place, the remainder the digit was cleansed, dressing applied and the tourniquet removed.  She will be discharged with an analgesic plan, some additional oral antibiotics to follow onto the IV antibiotics that she received in the emergency department.  I cautioned her against shear forces that can disrupt take on the skin graft, and my office will call her in the next couple of days to arrange a follow-up appointment, likely for Monday, 12-23-16.  Rayvon Char [address], [place],  [place]  [address] Office: [phone] Mobile: [phone]  12/15/2016, 1:37 PM

## 2016-12-15 NOTE — ED Provider Notes (Signed)
[facility] DEPT Provider Note   CSN: [id number] Arrival date & time: 12/15/16  1116     History   Chief Complaint Chief Complaint  Patient presents with  . Animal Bite  . Hand Injury    HPI Denise Smith is a 51 y.o. female.  The history is provided by the patient.  Animal Bite  Contact animal:  Dog Location:  Finger Finger injury location:  L index finger Time since incident:  1 hour Pain details:    Quality:  Aching   Severity:  Moderate   Timing:  Constant   Progression:  Unchanged Incident location:  Outside Provoked: unprovoked   Animal in possession: yes   Tetanus status:  Up to date Relieved by:  Nothing Worsened by:  Nothing Ineffective treatments:  None tried   Past Medical History:  Diagnosis Date  . Anemia   . [id number]     Patient Active Problem List   Diagnosis Date Noted  . Leiomyoma of uterus, unspecified 05/08/2013  . Pain in joint, lower leg 08/03/2008  . METATARSALGIA 08/03/2008  . PLANTAR FASCIITIS, RIGHT 04/29/2008  . FOOT PAIN, RIGHT 04/29/2008    Past Surgical History:  Procedure Laterality Date  . ABDOMINAL HYSTERECTOMY N/A 05/06/2013   Procedure: TOTAL ABDOMINAL HYSTERECTOMY;  Surgeon: Margarette Asal, MD;  Location: [facility];  Service: Gynecology;  Laterality: N/A;  . COLON RESECTION  1997  . LYSIS OF ADHESION N/A 05/06/2013   Procedure: LYSIS OF ADHESION;  Surgeon: Margarette Asal, MD;  Location: [facility];  Service: Gynecology;  Laterality: N/A;  . MOUTH SURGERY      OB History    No data available       Home Medications    Prior to Admission medications   Medication Sig Start Date End Date Taking? Authorizing Provider  ibuprofen (ADVIL) 200 MG tablet Take 4 tablets (800 mg total) by mouth every 8 (eight) hours as needed for pain. 05/08/13   Molli Posey, MD  OVER THE COUNTER MEDICATION Take 1 tablet by mouth daily. Black Raspberry Supplement    Historical Provider, MD  OVER THE COUNTER MEDICATION Take 1  tablet by mouth daily. Over the counter supplement IP6 for prevention of cancer    Historical Provider, MD  oxyCODONE-acetaminophen (PERCOCET/ROXICET) 5-325 MG per tablet Take 1-2 tablets by mouth every 4 (four) hours as needed. 05/08/13   Molli Posey, MD  SUMAtriptan (IMITREX) 25 MG tablet Take 25 mg by mouth every 2 (two) hours as needed for migraine.    Historical Provider, MD  VITAMIN E, TOPICAL, OIL Apply 1 application topically daily.    Historical Provider, MD    Family History No family history on file.  Social History Social History  Substance Use Topics  . Smoking status: Former Smoker    Quit date: 04/22/2008  . Smokeless tobacco: Never Used  . Alcohol use No     Allergies   Patient has no known allergies.   Review of Systems Review of Systems  All other systems reviewed and are negative.    Physical Exam Updated Vital Signs BP 120/83   Pulse 74   Temp 98.4 F (36.9 C)   Resp 16   Ht 5\' 7"  (1.702 m)   Wt 185 lb (83.9 kg)   LMP 04/21/2013   SpO2 96%   BMI 28.98 kg/m   Physical Exam  Constitutional: She is oriented to person, place, and time. She appears well-developed and well-nourished. No distress.  HENT:  Head: Normocephalic.  Nose: Nose normal.  Eyes: Conjunctivae are normal.  Neck: Neck supple. No tracheal deviation present.  Cardiovascular: Normal rate and regular rhythm.   Pulmonary/Chest: Effort normal. No respiratory distress.  Abdominal: Soft. She exhibits no distension.  Musculoskeletal:       Left hand: She exhibits deformity (with distal phalanx amputation of left index and large portion of ruptured tendon detached with amputated portion) and swelling. She exhibits normal range of motion (maintains flexor mechanism, limited ROM d/t pain).  Neurological: She is alert and oriented to person, place, and time.  Skin: Skin is warm and dry.  Psychiatric: She has a normal mood and affect.          ED Treatments / Results  Labs (all  labs ordered are listed, but only abnormal results are displayed) Labs Reviewed - No data to display  EKG  EKG Interpretation None       Radiology No results found.  Procedures Procedures (including critical care time)  Medications Ordered in ED Medications - No data to display   Initial Impression / Assessment and Plan / ED Course  I have reviewed the triage vital signs and the nursing notes.  Pertinent labs & imaging results that were available during my care of the patient were reviewed by me and considered in my medical decision making (see chart for details).     51 year old female presents with severe injury to the left distal index finger. It appears she has had a distal phalanx amputation after a pit bull bite with complete tendon rupture proximally which was brought in a bag on ice and pictured above.   Distal amputation is nonviable. Animal is owned by [employer] and is in possession and will be kept in quarantine to monitor for symptoms. Tetanus UTD. It appears the superficial tendon is in tact as she maintains flexor mechanism of leftover finger. Dr Grandville Silos came to bedside and formalized amputation. Given IV unasyn dose awaiting evaluation. Will continue home oral antibiotics and pain control, hand surgery to f/u in clinic, appreciate consultation.   Final Clinical Impressions(s) / ED Diagnoses   Final diagnoses:  Dog bite, initial encounter  Amputation finger, initial encounter  Injury of flexor tendon of hand, initial encounter    New Prescriptions Discharge Medication List as of 12/15/2016  1:59 PM    START taking these medications   Details  acetaminophen (TYLENOL) 325 MG tablet Take 2 tablets (650 mg total) by mouth every 6 (six) hours as needed for mild pain or moderate pain., Starting Sun 12/15/2016, OTC    amoxicillin-clavulanate (AUGMENTIN) 875-125 MG tablet Take 1 tablet by mouth 2 (two) times daily., Starting Sun 12/15/2016, Until Sun 12/22/2016, Print      !! ibuprofen (ADVIL) 200 MG tablet Take 3 tablets (600 mg total) by mouth every 6 (six) hours as needed for mild pain or moderate pain., Starting Sun 12/15/2016, OTC    oxyCODONE (ROXICODONE) 5 MG immediate release tablet Take 1 tablet (5 mg total) by mouth every 6 (six) hours as needed for severe pain., Starting Sun 12/15/2016, Print     !! - Potential duplicate medications found. Please discuss with provider.       Leo Grosser, MD 12/15/16 [phone]

## 2016-12-15 NOTE — ED Notes (Signed)
X-ray at bedside

## 2016-12-15 NOTE — ED Notes (Addendum)
Discharge instructions, follow up care, and prescriptions reviewed with patient. Patient verbalized understanding. 

## 2016-12-15 NOTE — Discharge Instructions (Signed)
Discharge Instructions   Move your fingers as much as possible, making a full fist and fully opening the fist. Elevate your hand to reduce pain & swelling of the digits.  Ice over the operative site may be helpful to reduce pain & swelling.  DO NOT USE HEAT. Pain medicine has been prescribed for you.  Leave the dressing in place until you return to our office.  You may shower, but keep the bandage clean & dry.  You may drive a car when you are off of prescription pain medications and can safely control your vehicle with both hands. Our office will call you to arrange follow-up   Please call [phone] during normal business hours or [phone] after hours for any problems. Including the following:  - excessive redness of the incisions - drainage for more than 4 days - fever of more than 101.5 F  *Please note that pain medications will not be refilled after hours or on weekends.

## 2016-12-15 NOTE — ED Triage Notes (Signed)
Per EMS, patient was attacked by a neighbor's 60 lbs pitbull. Patient's left index finger down to the first knuckle was tore off. 200 mcg of fentanyl given en route with EMS.

## 2017-03-06 ENCOUNTER — Other Ambulatory Visit: Payer: Self-pay | Admitting: Family

## 2017-03-06 ENCOUNTER — Telehealth: Payer: PPO | Admitting: Family

## 2017-03-06 DIAGNOSIS — W57XXXA Bitten or stung by nonvenomous insect and other nonvenomous arthropods, initial encounter: Principal | ICD-10-CM

## 2017-03-06 DIAGNOSIS — S0096XA Insect bite (nonvenomous) of unspecified part of head, initial encounter: Secondary | ICD-10-CM

## 2017-03-06 MED ORDER — DOXYCYCLINE HYCLATE 100 MG PO TABS: 100.0000 mg | ORAL_TABLET | Freq: Two times a day (BID) | ORAL | 0 refills | Status: DC

## 2017-03-06 NOTE — Progress Notes (Signed)
Thank you for the details you put in the comment boxes. Those details really help [place] take better care of you. Given that this was a week ago and your symptoms are progressing, the 1-time dose of antibiotics may not be sufficient. I'm also concerned about the lymph node involvement and headache. See treatment below. You should also schedule a face-to-face visit in the next week or so, ideally.   Thank you for describing your tick bite, Here is how we plan to help!  Based on the information that you shared with me it looks like you have A tick that bite that we will treat with a short course of doxycycline.  In most cases a tick bite is painless and does not itch.  Most tick bites in which the tick is quickly removed do not require prescriptions. Ticks can transmit several diseases if they are infected and remain attacked to your skin. Therefore the length that the tick was attached and any symptoms you have experienced after the bite are import to accurately develop your custom treatment plan. In most cases a single dose of doxycycline may prevent the development of a more serious condition.  Based on your information I have Provided a home care guide for tick bites  and  instructions on when to call for help. and Your symptoms indicate that you need a longer course of antibiotics and a follow up visit with a provider. I have sent doxycycline 100 mg twice a day for 21 days to the pharmacy that you selected. You will need to schedule a follow up visit with your provider. If you do not have a primary care provider you may use our telehealth physicians on the web at [facility].     Which ticks  are associated with illness?  The Wood Tick (dog tick) is the size of a watermelon seed and can sometimes transmit [facility] spotted fever and [place] tick fever.   The Deer Tick (black-legged tick) is between the size of a poppy seed (pin head) and an apple seed, and can sometimes transmit Lyme  disease.  A brown to black tick with a white splotch on its back is likely a female Amblyomma americanum ([place] tick). This tick has been associated with Southern Tick Associated illness ( STARI)  Lyme disease has become the most common tick-borne illness in the [place]. The risk of Lyme disease following a recognized deer tick bite is estimated to be 1%.  The majority of cases of Lyme disease start with a bull's eye rash at the site of the tick bite. The rash can occur days to weeks (typically 7-10 days) after a tick bite. Treatment with antibiotics is indicated if this rash appears. Flu-like symptoms may accompany the rash, including: fever, chills, headaches, muscle aches, and fatigue. Removing ticks promptly may prevent tick borne disease.  What can be used to prevent Tick Bites?   Insect repellant with at leas 20% DEET.  Wearing long pants with sock and shoes.  Avoiding tall grass and heavily wooded areas.  Checking your skin after being outdoors.  Shower with a washcloth after outdoor exposures.  HOME CARE ADVICE FOR TICK BITE  1. Wood Tick Removal:  o Use a pair of tweezers and grasp the wood tick close to the skin (on its head). Pull the wood tick straight upward without twisting or crushing it. Maintain a steady pressure until it releases its grip.   o If tweezers aren't available, use fingers, a loop  of thread around the jaws, or a needle between the jaws for traction.  o Note: covering the tick with petroleum jelly, nail polish or rubbing alcohol doesn't work. Neither does touching the tick with a hot or cold object. 2. Tiny Deer Tick Removal:   o Needs to be scraped off with a knife blade or credit card edge. o Place tick in a sealed container (e.g. glass jar, zip lock plastic bag), in case your doctor wants to see it. 3. Tick's Head Removal:  o If the wood tick's head breaks off in the skin, it must be removed. Clean the skin. Then use a sterile needle to uncover  the head and lift it out or scrape it off.  o If a very small piece of the head remains, the skin will eventually slough it off. 4. Antibiotic Ointment:  o Wash the wound and your hands with soap and water after removal to prevent catching any tick disease.  Apply an over the counter antibiotic ointment (e.g. bacitracin) to the bite once. 5. Expected Course: Tick bites normally don't itch or hurt. That's why they often go unnoticed. 6. Call Your Doctor If:  o You can't remove the tick or the tick's head o Fever, a severe head ache, or rash occur in the next 2 weeks o Bite begins to look infected o Lyme's disease is common in your area o You have not had a tetanus in the last 10 years o Your current symptoms become worse    MAKE SURE YOU   Understand these instructions.  Will watch your condition.  Will get help right away if you are not doing well or get worse.   Thank you for choosing an e-visit.  Your e-visit answers were reviewed by a board certified advanced clinical practitioner to complete your personal care plan. Depending upon the condition, your plan could have included both over the counter or prescription medications. Please review your pharmacy choice. If there is a problem you may use MyChart messaging to have the prescription routed to another pharmacy. Your safety is important to [place]. If you have drug allergies check your prescription carefully.   You can use MyChart to ask questions about today's visit, request a non-urgent call back, or ask for a work or school excuse for 24 hours related to this e-Visit. If it has been greater than 24 hours you will need to follow up with your provider, or enter a new e-Visit to address those concerns.  You will get an email in the next two days asking about your experience. I hope  that your e-visit has been valuable and will speed your recovery

## 2017-04-09 DIAGNOSIS — Z0000 Encounter for general adult medical examination without abnormal findings: Secondary | ICD-10-CM | POA: Diagnosis not present

## 2017-04-15 DIAGNOSIS — Z6827 Body mass index (BMI) 27.0-27.9, adult: Secondary | ICD-10-CM | POA: Diagnosis not present

## 2017-04-15 DIAGNOSIS — Z803 Family history of malignant neoplasm of breast: Secondary | ICD-10-CM | POA: Diagnosis not present

## 2017-04-15 DIAGNOSIS — J309 Allergic rhinitis, unspecified: Secondary | ICD-10-CM | POA: Diagnosis not present

## 2017-04-15 DIAGNOSIS — Z90710 Acquired absence of both cervix and uterus: Secondary | ICD-10-CM | POA: Diagnosis not present

## 2017-04-15 DIAGNOSIS — Z0000 Encounter for general adult medical examination without abnormal findings: Secondary | ICD-10-CM | POA: Diagnosis not present

## 2017-06-10 DIAGNOSIS — Z0000 Encounter for general adult medical examination without abnormal findings: Secondary | ICD-10-CM | POA: Diagnosis not present

## 2017-08-14 DIAGNOSIS — Z23 Encounter for immunization: Secondary | ICD-10-CM | POA: Diagnosis not present

## 2017-10-29 DIAGNOSIS — Z1211 Encounter for screening for malignant neoplasm of colon: Secondary | ICD-10-CM | POA: Diagnosis not present

## 2017-10-29 DIAGNOSIS — L918 Other hypertrophic disorders of the skin: Secondary | ICD-10-CM | POA: Diagnosis not present

## 2017-10-29 DIAGNOSIS — Z1501 Genetic susceptibility to malignant neoplasm of breast: Secondary | ICD-10-CM | POA: Diagnosis not present

## 2017-10-30 ENCOUNTER — Telehealth: Payer: Self-pay | Admitting: Genetics

## 2017-10-30 NOTE — Telephone Encounter (Signed)
Spoke with patient regarding appointment D/T/Loc/Ph# °

## 2017-11-17 ENCOUNTER — Telehealth: Payer: Self-pay | Admitting: Genetics

## 2017-11-17 NOTE — Telephone Encounter (Signed)
Returned call to patient to R/S her Genetics appointment.  I left message for her to call back to R/S.

## 2017-11-19 DIAGNOSIS — L918 Other hypertrophic disorders of the skin: Secondary | ICD-10-CM | POA: Diagnosis not present

## 2017-12-15 ENCOUNTER — Inpatient Hospital Stay: Payer: 59 | Attending: Genetic Counselor | Admitting: Genetics

## 2017-12-15 ENCOUNTER — Inpatient Hospital Stay: Payer: 59

## 2017-12-15 DIAGNOSIS — Z808 Family history of malignant neoplasm of other organs or systems: Secondary | ICD-10-CM | POA: Diagnosis not present

## 2017-12-15 DIAGNOSIS — Z803 Family history of malignant neoplasm of breast: Secondary | ICD-10-CM | POA: Diagnosis not present

## 2017-12-15 DIAGNOSIS — Z8051 Family history of malignant neoplasm of kidney: Secondary | ICD-10-CM

## 2017-12-15 DIAGNOSIS — Z1379 Encounter for other screening for genetic and chromosomal anomalies: Secondary | ICD-10-CM

## 2017-12-15 DIAGNOSIS — Z8052 Family history of malignant neoplasm of bladder: Secondary | ICD-10-CM | POA: Diagnosis not present

## 2017-12-15 DIAGNOSIS — Z806 Family history of leukemia: Secondary | ICD-10-CM

## 2017-12-16 ENCOUNTER — Encounter: Payer: Self-pay | Admitting: Genetics

## 2017-12-16 DIAGNOSIS — Z803 Family history of malignant neoplasm of breast: Secondary | ICD-10-CM | POA: Insufficient documentation

## 2017-12-16 DIAGNOSIS — Z8052 Family history of malignant neoplasm of bladder: Secondary | ICD-10-CM | POA: Insufficient documentation

## 2017-12-16 DIAGNOSIS — Z1379 Encounter for other screening for genetic and chromosomal anomalies: Secondary | ICD-10-CM | POA: Insufficient documentation

## 2017-12-16 DIAGNOSIS — Z808 Family history of malignant neoplasm of other organs or systems: Secondary | ICD-10-CM | POA: Insufficient documentation

## 2017-12-16 DIAGNOSIS — Z806 Family history of leukemia: Secondary | ICD-10-CM | POA: Insufficient documentation

## 2017-12-16 DIAGNOSIS — Z8051 Family history of malignant neoplasm of kidney: Secondary | ICD-10-CM | POA: Insufficient documentation

## 2017-12-16 NOTE — Progress Notes (Signed)
REFERRING PROVIDER: Deland Pretty, MD [address] [place], [place] [id number]  PRIMARY PROVIDER:  Deland Pretty, MD  PRIMARY REASON FOR VISIT:  1. Family history of breast cancer   2. Family history of bladder cancer   3. Family history of leukemia   4. Family history of skin cancer   5. Family history of kidney cancer   6. Genetic testing      HISTORY OF PRESENT ILLNESS:   [place], a 52 y.o. female, was seen for a [facility] cancer genetics consultation at the request of Dr. Shelia Media due to a family history of cancer and Variant of uncertain significance identified in previous genetic testing.  [place] presents to clinic today to discuss the possibility of a hereditary predisposition to cancer, genetic testing, and to further clarify her future cancer risks, as well as potential cancer risks for family members.   [place] is a 52 y.o. female with no personal history of cancer.    She had genetic testing ordered by Dr. Shelia Media due to a family history of breast cancer.  The test ordered was BRCA1/2 Comprehensive Analysis.  The results revealed a heterozygous variant of uncertain significance in the gene BRCA2 called c.-9T>C. The date of this test report is 06/09/2018.   HORMONAL RISK FACTORS:  Menarche was at age 83.  First live birth at age N/A.  Ovaries intact: yes.  Hysterectomy: yes. At the age of about 83 Menopausal status: postmenopausal.  HRT use: 0 years. Colonoscopy: no; not examined. Mammogram within the last year: yes. Number of breast biopsies: reports having a lump that was examined, but ended up 'being clear'. Reports it was not actually biopsied, a different test.   Past Medical History:  Diagnosis Date  . Anemia   . Family history of bladder cancer   . Family history of breast cancer   . Family history of kidney cancer   . Family history of leukemia   . Family history of skin cancer   . QASTMHDQ(222.9)     Past Surgical History:    Procedure Laterality Date  . ABDOMINAL HYSTERECTOMY N/A 05/06/2013   Procedure: TOTAL ABDOMINAL HYSTERECTOMY;  Surgeon: Margarette Asal, MD;  Location: [facility];  Service: Gynecology;  Laterality: N/A;  . COLON RESECTION  1997  . LYSIS OF ADHESION N/A 05/06/2013   Procedure: LYSIS OF ADHESION;  Surgeon: Margarette Asal, MD;  Location: [facility];  Service: Gynecology;  Laterality: N/A;  . MOUTH SURGERY      Social History   Socioeconomic History  . Marital status: Married    Spouse name: None  . Number of children: None  . Years of education: None  . Highest education level: None  Social Needs  . Financial resource strain: None  . Food insecurity - worry: None  . Food insecurity - inability: None  . Transportation needs - medical: None  . Transportation needs - non-medical: None  Occupational History  . None  Tobacco Use  . Smoking status: Former Smoker    Last attempt to quit: 04/22/2008    Years since quitting: 9.6  . Smokeless tobacco: Never Used  Substance and Sexual Activity  . Alcohol use: No  . Drug use: No  . Sexual activity: None  Other Topics Concern  . None  Social History Narrative  . None     FAMILY HISTORY:  We obtained a detailed, 4-generation family history.  Significant diagnoses are listed below: Family History  Problem Relation Age  of Onset  . Breast cancer Mother 76  . Bladder Cancer Father 59  . Kidney cancer Father        dx 63's, thinks it was a second primary rather than met  . Breast cancer Sister        dx 24's.   . Leukemia Maternal Grandfather   . Skin cancer Maternal Jon Gills        'a couple removed in his 67's'  . Heart disease Paternal Grandmother   . Breast cancer Other   . Breast cancer Other    [place] has no children.  [place]ise Smith has a sister who is 10.  This sister has no biological children.  This sister had [employer] direct to consumer genetic testing that 'provided her a percent risk for breast cancer'.  Based on this  information, [place] sister decided to have prophylactic bilateral mastectomies in her 6's   Pathology analysis of her breast tissue after this surgery revealed cancerous cells.  Denise [place] does not know details about her sister's genetic test results.  She reported she would ask her sister about them, but was not sure she would be willing to share/get the test report.   [place] father was diagnosed with bladder cancer in his 49's and later kidney cancer.  His bladder cancer did metastasize, but she believes the kidney cancer in her father was a new primary rather than a metastasis from his bladder cancer.  He died at the age of 58.  Denise Smith does know know if her father had any siblings and does not know any information about her paternal grandfather.  She knows her paternal grandmother died older than 77 due to heart disease.  This grandmother had 2 sisters (patient's great aunts' with breast cancer.  Age dx unk.    [place] mother died at 8 due to metastatic breast cancer diagnosed in her 29's.  She believes her mother had a hysterectomy in her lifetime.  [place] mother was an only child.  [place] maternal grandfather died in his 39's due to leukemia.  He also had a few skin cancers (type unk) removed in his 22's.  [place] maternal grandmother died in her 34's with no history of cancer.   Patient's maternal ancestors are of French/Irish descent, and paternal ancestors are of [place] descent. There is no reported Ashkenazi Jewish ancestry. There is no known consanguinity.  GENETIC COUNSELING ASSESSMENT: Denise Smith is a 52 y.o. female with a family history which is somewhat suggestive of a Hereditary Cancer Predisposition Syndrome. We, therefore, discussed and recommended the following at today's visit.   Her BRCA1/2 Comprehensive Analysis Results: One of the most common hereditary cancer syndromes that increases breast cancer risk is called Hereditary Breast and  Ovarian Cancer (HBOC) syndrome.  This syndrome is caused by mutations in the BRCA1 and BRCA2 genes.  This syndrome increases an individual's lifetime risk to develop breast, ovarian, pancreatic, and other types of cancer.   Her testing revealed a Heterozygous  Variant of uncertain significance in the genet BRCA2 called c.-9T>C.   Variants of uncertain significance (VUS's) are variants for which there is not enough information to classify it as pathogenic or benign.  It is unknown if this variant is associated with increased cancer risk or if this is a normal finding.  Many VUS's do get reclassified over time as more data is available.  The large  majority of VUS's are reclassified to benign or likely benign.  VUS's should not be used to make medical management decisions. With time, we suspect the lab will determine the significance of any VUS's. Laboratories will typically issue amended reports to the ordering provider if a variant is reclassified in the future.    This result should be interpreted as a 'negative result' unless the variant is reclassified as pathogenic or likely pathogenic in the future.    Note: The c.-9T>C variant in BRCA2 has conflicting interpretations in the [employer]. Some labs are also calling it a variant of uncertain significance and some labs have classified this variant as likely benign.    We explained that genetic testing is not perfect and cannot definitively rule out a hereditary cause for her family history of cancer.  There are many other genes that increase cancer risk for which she did not have genetic testing for, there cold be genes we have not yet discovered to increase cancer risk, or our technology may not detcect all possible mutations at this time. It is also possible there is a hereditary cause for the cancer in her family that [place] did not inherit and therefore was not identified in her genetic testing.   FURTHER DISCUSSION:  We discussed that  only 5-10% of cancers are associated with a Hereditary cancer predisposition syndrome.   There are also many other cancer predisposition syndromes caused by mutations in several other genes.  We discussed that while no pathogenic mutations were identified in her BRCA1 and BRCA2 genes, there are many other genes that are associated with hereditary breast cancer risk for which she was not tested for. For example, PALB2, CHEK2, ATM, NBN, STK11, and others.  We discussed that genetic testing is often ordered as a panel of genes.  We provided Den[place] the option to pursue panel genetic testing to determine if she carries a pathogenic mutation in a different hereditary breast cancer gene or other hereditary cancer gene.   We recommended the Common Hereditary Cancer Panel. The Common Hereditary Cancer Panel offered by [facility] includes sequencing and/or deletion duplication testing of the following 47 genes: APC, ATM, AXIN2, BARD1, BMPR1A, BRCA1, BRCA2, BRIP1, CDH1, CDKN2A (p14ARF), CDKN2A (p16INK4a), CKD4, CHEK2, CTNNA1, DICER1, EPCAM (Deletion/duplication testing only), GREM1 (promoter region deletion/duplication testing only), KIT, MEN1, MLH1, MSH2, MSH3, MSH6, MUTYH, NBN, NF1, NHTL1, PALB2, PDGFRA, PMS2, POLD1, POLE, PTEN, RAD50, RAD51C, RAD51D, SDHB, SDHC, SDHD, SMAD4, SMARCA4. STK11, TP53, TSC1, TSC2, and VHL.  The following genes were evaluated for sequence changes only: SDHA and HOXB13 c.251G>A variant only.  We discussed that if she is found to have a mutation in one of these genes, it may impact future medical management recommendations such as increased cancer screenings and consideration of risk reducing surgeries.  A positive result could also have implications for the patient's family members.  A Negative result would mean we were unable to identify a hereditary component to her family's cancer, but does not rule out the possibility of a hereditary basis for her family's cancer.  There could be  mutations that are undetectable by current technology, or in genes not yet tested or identified to increase cancer risk.    We discussed the potential to find a Variant of Uncertain Significance or VUS.  These are variants that have not yet been identified as pathogenic or benign, and it is unknown if this variant is associated with increased cancer risk or if this is a normal finding.  Most VUS's are reclassified to benign or likely benign.   It  should not be used to make medical management decisions. With time, we suspect the lab will determine the significance of any VUS's identified if any.   Based on [place] family history of cancer, she meets medical criteria for genetic testing. We discussed that because her insurance has already paid for a hereditary cancer genetic test, they may or may not cover the cost of additional genetic testing.  We discussed the laboratory calls patients if their OOP cost is expected to be >$100.  There is also a patient pay prices of $250 should insurance not cover testing and she choose to self pay.       Based on the patient's personal and family history, the statistical model (Tyrer Cusik)   Was used to estimate her risk of developing breast cancer. This estimates her lifetime risk of developing breast cancer to be approximately 20.8%-29.6%.  The variability of this range is due to not having Ms. Delia's breast density category.  She reports she has been told she has 'dense breasts'.   Breast density: B scattered fibroglandular density- 20.8% C: Heterogenously dense-  29.6% Unk density:-27.1%  A pathogenic mutation in a different breast cancer risk gene and/or genetic test results from her sister may impact this estimate.   The patient's lifetime breast cancer risk is a preliminary estimate based on available information using one of several models endorsed by the [place] (ACS). The ACS recommends consideration of breast MRI screening as an  adjunct to mammography for patients at high risk (defined as 20% or greater lifetime risk). A more detailed breast cancer risk assessment can be considered, if clinically indicated.   Ms. Guyett has been determined to be at 'high risk' for breast cancer.  Therefore, we recommend that annual screening with mammography and breast MRI begin at age 9, or 10 years prior to the age of breast cancer diagnosis in a relative (whichever is earlier).  We discussed that Ms. Brzozowski should discuss her individual situation with her referring physician and determine a breast cancer screening plan with which they are both comfortable.      PLAN: After considering the risks, benefits, and limitations, Ms. Grullon decided not to pursue panel genetic testing today.    We understand this decision, and remain available to coordinate genetic testing at any time in the future. We; therefore, recommend Ms. Hush continue to follow the cancer screening guidelines given by her primary healthcare provider.  Lastly, we encouraged Ms. Goodnow to remain in contact with cancer genetics annually so that we can continuously update the family history and inform her of any changes in cancer genetics and testing that may be of benefit for this family. We strongly encouraged her to inform us if she is able to get her sister's genetic testing report.  We also recommended she check in with [place] every year or 2 to ask about any updates regarding her variant of uncertain significance as well as updates in the field of genetic testing.   Ms.  Milewski questions were answered to her satisfaction today. Our contact information was provided should additional questions or concerns arise. Thank you for the referral and allowing [place] to share in the care of your patient.   Tana Felts, MS, [facility] Certified Genetic Counselor [email or web address] phone: [phone]  The patient was seen for a total of 45 minutes in face-to-face genetic  counseling.  The patient was accompanied today by her husband.

## 2018-01-19 ENCOUNTER — Encounter: Payer: Self-pay | Admitting: Sports Medicine

## 2018-01-19 ENCOUNTER — Ambulatory Visit (HOSPITAL_COMMUNITY)
Admission: RE | Admit: 2018-01-19 | Discharge: 2018-01-19 | Disposition: A | Discharge status: Home / Self Care | Payer: 59 | Source: Ambulatory Visit | Attending: Sports Medicine | Admitting: Sports Medicine

## 2018-01-19 ENCOUNTER — Ambulatory Visit: Payer: 59 | Admitting: Sports Medicine

## 2018-01-19 VITALS — BP 117/78 | Ht 67.0 in | Wt 175.0 lb

## 2018-01-19 DIAGNOSIS — M7732 Calcaneal spur, left foot: Secondary | ICD-10-CM | POA: Insufficient documentation

## 2018-01-19 DIAGNOSIS — M79672 Pain in left foot: Secondary | ICD-10-CM | POA: Insufficient documentation

## 2018-01-19 DIAGNOSIS — M19072 Primary osteoarthritis, left ankle and foot: Secondary | ICD-10-CM | POA: Diagnosis not present

## 2018-01-19 NOTE — Assessment & Plan Note (Signed)
Patient presents with left dorsal foot pain for the past two weeks in the setting of significant increase in running routinue concerning for stress fracture. On exam, patient has tenderness to palpation between 4th and 5th MTP without any significant swelling or erythema. Most pain noted while walking on her tip toes. Left foot U/S shows some fluid around her 4th MTP but no cortical change or increase blood flow concern for overt fracture. Based on exam and U/S findings pain is likely 2/2 inflammation, however would like to have additional imaging to further rule out fracture. --Will order Xray of her left foot and follow up on results --Continue to conservative treatment with rest, ice and NSAIDs as needed --Patient can bike and swim in preparation for her sprint-tri in may but should refrain from running for at least a 1-2 weeks.

## 2018-01-19 NOTE — Progress Notes (Signed)
Subjective:    Patient ID: [place], female    DOB: Nov 19, 1965, 52 y.o.   MRN: [id number]   CC: Left dorsal foot pain  HPI: Patient is 52 yo female who presents today complaining of left foot pain. Patient reports pain started two weeks ago after she suddenly significantly increase her daily running routine in preparation for a hal marathon. Patient went from running 3-4 miles daily to a 9 mile run. Patient report after her run she started to experience left dorsal foot pain localized between her 4th and 5th digit. Patient report that pain was initially sharp in nature with mild swelling. Patient reports  She rested and stop running and started to notice improvement in her pain. A week later patient states that she was walking around the house when she felt a pop/"zing" causing significant pain in the same area. Patient reports that pain initially was debilitating but improve to the point she thought it was healed. However, in the past few days she has experienced some tender in that area concerning to her. Most of the pain and when she is trying  To wear her pants and has to be on her tip toes.   Smoking status reviewed   ROS: all other systems were reviewed and are negative other than in the HPI   Past Medical History:  Diagnosis Date  . Anemia   . Family history of bladder cancer   . Family history of breast cancer   . Family history of kidney cancer   . Family history of leukemia   . Family history of skin cancer   . KCLEXNTZ(001.7)     Past Surgical History:  Procedure Laterality Date  . ABDOMINAL HYSTERECTOMY N/A 05/06/2013   Procedure: TOTAL ABDOMINAL HYSTERECTOMY;  Surgeon: Margarette Asal, MD;  Location: [facility];  Service: Gynecology;  Laterality: N/A;  . COLON RESECTION  1997  . LYSIS OF ADHESION N/A 05/06/2013   Procedure: LYSIS OF ADHESION;  Surgeon: Margarette Asal, MD;  Location: [facility];  Service: Gynecology;  Laterality: N/A;  . MOUTH SURGERY      Past  medical history, surgical, family, and social history reviewed and updated in the EMR as appropriate.  Objective:  BP 117/78   Ht 5\' 7"  (1.702 m)   Wt 175 lb (79.4 kg)   LMP 04/21/2013   BMI 27.41 kg/m   Vitals and nursing note reviewed  Left Foot exam: No swelling, deformities or erythema noted on inspection. Tender on palpation between her 4th and 5th MTP. Normal dorsiflexion and plantar flexion. Patient able to invert and evert foot. Strength 5/5. Pulse was palpated and foot was warm.  Assessment & Plan:    Left foot pain Patient presents with left dorsal foot pain for the past two weeks in the setting of significant increase in running routinue concerning for stress fracture. On exam, patient has tenderness to palpation between 4th and 5th MTP without any significant swelling or erythema. Most pain noted while walking on her tip toes. Left foot U/S shows some fluid around her 4th MTP but no cortical change or increase blood flow concern for overt fracture. Based on exam and U/S findings pain is likely 2/2 inflammation, however would like to have additional imaging to further rule out fracture. --Will order Xray of her left foot and follow up on results --Continue to conservative treatment with rest, ice and NSAIDs as needed --Patient can bike and swim in preparation for her sprint-tri in may but  should refrain from running for at least a 1-2 weeks.     Marjie Skiff, MD [place] PGY-2  Patient seen and evaluated with the resident. I agree with the above plan of care. Ultrasound today showed some slight hypoechoic change around the fourth metatarsal but no cortical irregularity and no significant neovascularity seen. X-ray of the left foot shows no obvious stress fracture. Clinically, I do think she has a stress reaction of the fourth metatarsal. She is not limping so I do not think she needs immobilization but I did recommend that she refrain from running until  April 18. She has an appointment with Dr.Fields at that time and she may benefit from custom orthotics.

## 2018-01-29 ENCOUNTER — Ambulatory Visit (INDEPENDENT_AMBULATORY_CARE_PROVIDER_SITE_OTHER): Payer: 59 | Admitting: Sports Medicine

## 2018-01-29 ENCOUNTER — Encounter: Payer: Self-pay | Admitting: Sports Medicine

## 2018-01-29 VITALS — BP 102/76 | Ht 67.0 in | Wt 173.0 lb

## 2018-01-29 DIAGNOSIS — M79672 Pain in left foot: Secondary | ICD-10-CM

## 2018-01-29 NOTE — Progress Notes (Signed)
Subjective:    Patient ID: Denise Smith, female    DOB: May 23, 1966, 52 y.o.   MRN: [id number]   CC: Left foot pain follow up   HPI: Patient is a 52 year old female who presents today following up on left foot pain.  Patient was seen last week in our office with pain localized between her 4th and 5th metatarsal.  Ultrasound showed some fluid around both digits more consistent with inflammation however we could not rule out a stress fracture initially.  Foot x-ray was negative for any fracture.  Patient was asked to avoid running for week and to continue conservative management (RICE).  Patient reports that yesterday she had a 2 mile run and reported minimal discomfort in her left foot.  She does not have point tenderness around her fourth and fifth metatarsal anymore but reports more of dull pain in a bandlike fashion across her transverse arch in her left foot.  She rates her pain 1/10.  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 Anemia   . Family history of bladder cancer   . Family history of breast cancer   . Family history of kidney cancer   . Family history of leukemia   . Family history of skin cancer   . [id number](341.9)     Past Surgical History:  Procedure Laterality Date  . ABDOMINAL HYSTERECTOMY N/A 05/06/2013   Procedure: TOTAL ABDOMINAL HYSTERECTOMY;  Surgeon: Margarette Asal, MD;  Location: [facility];  Service: Gynecology;  Laterality: N/A;  . COLON RESECTION  1997  . LYSIS OF ADHESION N/A 05/06/2013   Procedure: LYSIS OF ADHESION;  Surgeon: Margarette Asal, MD;  Location: [facility];  Service: Gynecology;  Laterality: N/A;  . MOUTH SURGERY      Past medical history, surgical, family, and social history reviewed and updated in the EMR as appropriate.  Objective:  BP 102/76   Ht 5\' 7"  (1.702  m)   Wt 173 lb (78.5 kg)   LMP 04/21/2013   BMI 27.10 kg/m   Vitals and nursing note reviewed  General: NAD, pleasant, able to participate in exam Left Foot exam: No swelling or erythema, 4th and 5th hammertoe bilateral. Bilateral mild pronation with moderate loss of longitudinal arch. Bilateral transverse arch collapse with L>R.  mild widening of left forefoot/ no swelling/ Recreation of longitudinal arch with plantar flexion. Tender on palpation between her 4th and 5th MTP. Normal dorsiflexion and plantar flexion. Patient able to invert and evert foot. Strength 5/5. Foot warm with palpable pulse.    Assessment & Plan:    Left foot pain Left foot pain much improved since last office visit. Patient able to run 2 miles without any discomfort. On exam, patient has loss of transverse arch bilaterally causing flattening of her forefoot and increase pressure around metatarsal. Provided metatarsal pad for additional forefoot support. Instructions given to continue to slowly increase running distance while training for sprint tri. Patient also might benefit from custom orthotic as she intend to run a marathon in [place] in August 2019. Will follow up with Dr. Oneida Alar in a few weeks for further assessment.    Marjie Skiff, MD [place] PGY-2  I observed and examined the patient with the resident and agree with assessment and plan.  Note reviewed and modified  by me. Stefanie Libel, MD

## 2018-01-29 NOTE — Assessment & Plan Note (Addendum)
Left foot pain much improved since last office visit. Patient able to run 2 miles without any discomfort. On exam, patient has loss of transverse arch bilaterally causing flattening of her forefoot and increase pressure around metatarsal. Provided metatarsal pad for additional forefoot support.  Also added sports insoles Running gait shows midfoot strike with good form overall Instructions given to continue to slowly increase running distance while training for sprint tri. Patient also might benefit from custom orthotic as she intend to run a marathon in [place] in August 2019. Will follow up with Dr. Oneida Alar in a few weeks for further assessment.

## 2018-03-12 ENCOUNTER — Encounter: Payer: Self-pay | Admitting: Sports Medicine

## 2018-03-12 ENCOUNTER — Ambulatory Visit (INDEPENDENT_AMBULATORY_CARE_PROVIDER_SITE_OTHER): Payer: 59 | Admitting: Sports Medicine

## 2018-03-12 DIAGNOSIS — M7742 Metatarsalgia, left foot: Secondary | ICD-10-CM | POA: Diagnosis not present

## 2018-03-12 NOTE — Progress Notes (Signed)
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 1 month ago we put her in temporary orthotics for left foot pain We added metatarsal support Since that time her foot pain has improved significantly at work For running she feels like she needs additional support She comes for orthotics  About 10 years ago I put her in custom orthotics for similar problem and she improved  Past medical history Former smoker History of right plantar fasciitis in 2009  Review of systems Forefoot pain on the left is now about 50% less Some wound arch pain bilaterally Back pain with standing too long  Physical examination Pleasant white female in no acute distress BP 112/80   Ht 5\' 7"  (1.702 m)   Wt 173 lb (78.5 kg)   LMP 04/21/2013   BMI 27.10 kg/m   Bilateral pronation Loss of longitudinal arch bilaterally Bilateral transverse arch flattening Some splaying between toes No pain over the plantar fascia Good first toe movement Mild pain on the left distal second and third metatarsals Leg lengths are equal  Standing and walking and running gait with pronation  Patient was fitted for a : standard, cushioned, semi-rigid orthotic. The orthotic was heated and afterward the patient stood on the orthotic blank positioned on the orthotic stand. The patient was positioned in subtalar neutral position and 10 degrees of ankle dorsiflexion in a weight bearing stance. After completion of molding, a stable base was applied to the orthotic blank. The blank was ground to a stable position for weight bearing. Size: 7 red eva med. Base: blue med dens EVA Posting: Left small MT pad Additional orthotic padding: none  On completion of orthotics patient had a more neutral running gait She had significant comfort She tends more toward a midfoot forefoot strike

## 2018-03-12 NOTE — Assessment & Plan Note (Signed)
Custom orthotics prepared today Continue to wear some metatarsal pads and sports insoles and other shoes Wear orthotics for work For running she needs to stick with the custom orthotics  Recheck if not getting adequate relief

## 2018-06-04 DIAGNOSIS — Z1321 Encounter for screening for nutritional disorder: Secondary | ICD-10-CM | POA: Diagnosis not present

## 2018-06-04 DIAGNOSIS — Z0000 Encounter for general adult medical examination without abnormal findings: Secondary | ICD-10-CM | POA: Diagnosis not present

## 2018-06-10 DIAGNOSIS — L858 Other specified epidermal thickening: Secondary | ICD-10-CM | POA: Diagnosis not present

## 2018-06-10 DIAGNOSIS — Z780 Asymptomatic menopausal state: Secondary | ICD-10-CM | POA: Diagnosis not present

## 2018-06-10 DIAGNOSIS — G43909 Migraine, unspecified, not intractable, without status migrainosus: Secondary | ICD-10-CM | POA: Diagnosis not present

## 2018-06-10 DIAGNOSIS — Z0000 Encounter for general adult medical examination without abnormal findings: Secondary | ICD-10-CM | POA: Diagnosis not present

## 2018-06-10 DIAGNOSIS — M67431 Ganglion, right wrist: Secondary | ICD-10-CM | POA: Diagnosis not present

## 2018-06-10 DIAGNOSIS — H9319 Tinnitus, unspecified ear: Secondary | ICD-10-CM | POA: Diagnosis not present

## 2018-06-10 DIAGNOSIS — Z1501 Genetic susceptibility to malignant neoplasm of breast: Secondary | ICD-10-CM | POA: Diagnosis not present

## 2018-06-10 DIAGNOSIS — Z6826 Body mass index (BMI) 26.0-26.9, adult: Secondary | ICD-10-CM | POA: Diagnosis not present

## 2018-06-10 DIAGNOSIS — I7300 Raynaud's syndrome without gangrene: Secondary | ICD-10-CM | POA: Diagnosis not present

## 2018-06-10 DIAGNOSIS — J309 Allergic rhinitis, unspecified: Secondary | ICD-10-CM | POA: Diagnosis not present

## 2018-06-17 ENCOUNTER — Other Ambulatory Visit: Payer: Self-pay | Admitting: Internal Medicine

## 2018-06-17 DIAGNOSIS — Z780 Asymptomatic menopausal state: Secondary | ICD-10-CM | POA: Diagnosis not present

## 2018-06-17 DIAGNOSIS — Z1231 Encounter for screening mammogram for malignant neoplasm of breast: Secondary | ICD-10-CM

## 2018-06-17 DIAGNOSIS — L918 Other hypertrophic disorders of the skin: Secondary | ICD-10-CM | POA: Diagnosis not present

## 2018-06-17 DIAGNOSIS — Z01419 Encounter for gynecological examination (general) (routine) without abnormal findings: Secondary | ICD-10-CM | POA: Diagnosis not present

## 2018-06-17 DIAGNOSIS — Z1212 Encounter for screening for malignant neoplasm of rectum: Secondary | ICD-10-CM | POA: Diagnosis not present

## 2018-06-17 DIAGNOSIS — R232 Flushing: Secondary | ICD-10-CM | POA: Diagnosis not present

## 2018-07-15 ENCOUNTER — Ambulatory Visit
Admission: RE | Admit: 2018-07-15 | Discharge: 2018-07-15 | Disposition: A | Discharge status: Home / Self Care | Payer: 59 | Source: Ambulatory Visit | Attending: Internal Medicine | Admitting: Internal Medicine

## 2018-07-15 DIAGNOSIS — Z1231 Encounter for screening mammogram for malignant neoplasm of breast: Secondary | ICD-10-CM | POA: Diagnosis not present

## 2018-08-20 DIAGNOSIS — R42 Dizziness and giddiness: Secondary | ICD-10-CM | POA: Diagnosis not present

## 2018-08-20 DIAGNOSIS — H6992 Unspecified Eustachian tube disorder, left ear: Secondary | ICD-10-CM | POA: Diagnosis not present

## 2018-08-20 DIAGNOSIS — R51 Headache: Secondary | ICD-10-CM | POA: Diagnosis not present

## 2018-08-21 ENCOUNTER — Other Ambulatory Visit (HOSPITAL_COMMUNITY): Payer: Self-pay | Admitting: Family Medicine

## 2018-08-21 DIAGNOSIS — R519 Headache, unspecified: Secondary | ICD-10-CM

## 2018-08-21 DIAGNOSIS — R51 Headache: Principal | ICD-10-CM

## 2018-08-21 DIAGNOSIS — R42 Dizziness and giddiness: Secondary | ICD-10-CM

## 2018-08-25 DIAGNOSIS — Z1211 Encounter for screening for malignant neoplasm of colon: Secondary | ICD-10-CM | POA: Diagnosis not present

## 2018-08-25 DIAGNOSIS — H524 Presbyopia: Secondary | ICD-10-CM | POA: Diagnosis not present

## 2018-08-25 DIAGNOSIS — H52223 Regular astigmatism, bilateral: Secondary | ICD-10-CM | POA: Diagnosis not present

## 2018-08-25 DIAGNOSIS — R140 Abdominal distension (gaseous): Secondary | ICD-10-CM | POA: Diagnosis not present

## 2018-08-25 DIAGNOSIS — H5213 Myopia, bilateral: Secondary | ICD-10-CM | POA: Diagnosis not present

## 2018-08-31 ENCOUNTER — Ambulatory Visit (HOSPITAL_COMMUNITY)
Admission: RE | Admit: 2018-08-31 | Discharge: 2018-08-31 | Disposition: A | Discharge status: Home / Self Care | Payer: 59 | Source: Ambulatory Visit | Attending: Family Medicine | Admitting: Family Medicine

## 2018-08-31 DIAGNOSIS — R51 Headache: Secondary | ICD-10-CM | POA: Insufficient documentation

## 2018-08-31 DIAGNOSIS — R519 Headache, unspecified: Secondary | ICD-10-CM

## 2018-08-31 DIAGNOSIS — R42 Dizziness and giddiness: Secondary | ICD-10-CM | POA: Diagnosis not present

## 2018-08-31 MED ORDER — GADOBUTROL 1 MMOL/ML IV SOLN
7.0000 mL | Freq: Once | INTRAVENOUS | Status: AC | PRN
Start: 2018-08-31 — End: 2018-08-31
  Administered 2018-08-31: 7 mL via INTRAVENOUS

## 2018-09-09 DIAGNOSIS — R51 Headache: Secondary | ICD-10-CM | POA: Diagnosis not present

## 2018-09-21 DIAGNOSIS — D122 Benign neoplasm of ascending colon: Secondary | ICD-10-CM | POA: Diagnosis not present

## 2018-09-21 DIAGNOSIS — Z1211 Encounter for screening for malignant neoplasm of colon: Secondary | ICD-10-CM | POA: Diagnosis not present

## 2018-09-21 DIAGNOSIS — K621 Rectal polyp: Secondary | ICD-10-CM | POA: Diagnosis not present

## 2018-09-21 DIAGNOSIS — D128 Benign neoplasm of rectum: Secondary | ICD-10-CM | POA: Diagnosis not present

## 2018-09-21 DIAGNOSIS — K635 Polyp of colon: Secondary | ICD-10-CM | POA: Diagnosis not present

## 2018-12-04 MED FILL — TOPIRAMATE 50 MG TABLET: 50 | 30 days supply | Qty: 30 | Fill #0

## 2019-02-18 MED FILL — TOPIRAMATE 50 MG TABLET: 50 | 30 days supply | Qty: 30 | Fill #0

## 2019-02-18 MED FILL — DICLOFENAC SODIUM 1 % GEL: 1 | 13 days supply | Qty: 100 | Fill #0

## 2019-03-24 MED FILL — TOPIRAMATE 50 MG TABLET: 50 | 30 days supply | Qty: 30 | Fill #0

## 2019-04-06 MED FILL — TRIAZOLAM 0.125 MG TABLET: 0.125 | 1 days supply | Qty: 3 | Fill #0

## 2019-06-01 MED FILL — TOPIRAMATE 50 MG TABLET: 50 | 30 days supply | Qty: 30 | Fill #1

## 2019-07-29 ENCOUNTER — Other Ambulatory Visit: Payer: Self-pay

## 2019-07-29 ENCOUNTER — Ambulatory Visit (INDEPENDENT_AMBULATORY_CARE_PROVIDER_SITE_OTHER): Payer: No Typology Code available for payment source

## 2019-07-29 ENCOUNTER — Encounter: Payer: Self-pay | Admitting: Osteopathic Medicine

## 2019-07-29 ENCOUNTER — Ambulatory Visit (INDEPENDENT_AMBULATORY_CARE_PROVIDER_SITE_OTHER): Payer: No Typology Code available for payment source | Admitting: Osteopathic Medicine

## 2019-07-29 VITALS — BP 114/80 | HR 60 | Ht 67.0 in | Wt 178.0 lb

## 2019-07-29 DIAGNOSIS — M25512 Pain in left shoulder: Secondary | ICD-10-CM

## 2019-07-29 DIAGNOSIS — Z803 Family history of malignant neoplasm of breast: Secondary | ICD-10-CM

## 2019-07-29 DIAGNOSIS — G43709 Chronic migraine without aura, not intractable, without status migrainosus: Secondary | ICD-10-CM

## 2019-07-29 DIAGNOSIS — N951 Menopausal and female climacteric states: Secondary | ICD-10-CM

## 2019-07-29 DIAGNOSIS — Z1231 Encounter for screening mammogram for malignant neoplasm of breast: Secondary | ICD-10-CM

## 2019-07-29 DIAGNOSIS — G8929 Other chronic pain: Secondary | ICD-10-CM | POA: Diagnosis not present

## 2019-07-29 DIAGNOSIS — Z0000 Encounter for general adult medical examination without abnormal findings: Secondary | ICD-10-CM

## 2019-07-29 DIAGNOSIS — M25562 Pain in left knee: Secondary | ICD-10-CM

## 2019-07-29 DIAGNOSIS — Z13220 Encounter for screening for lipoid disorders: Secondary | ICD-10-CM

## 2019-07-29 DIAGNOSIS — M25531 Pain in right wrist: Secondary | ICD-10-CM

## 2019-07-29 NOTE — Patient Instructions (Addendum)
Plan:  For migraines, can continue current medications.  Refills are sent to pharmacy.  Please let me know if you would be interested in changing preventive medications from Topamax to something else!  For wrist, try wearing the splint as much as you are able.  Can continue the Voltaren gel.  We will get x-rays today  For the shoulder, will print some exercises/stretches to try at home but I think there might be an issue with the rotator cuff.  We will get an x-ray today and would like you to see sports medicine for this.  For the knee, will get x-ray today but it does not really seem consistent with arthritis or issue of the joint itself.  See printed home exercises and would also follow-up with sports medicine on this  Labs sometime next couple weeks if you can! We need to do an official Annual / Preventive visit sometime!

## 2019-07-29 NOTE — Progress Notes (Signed)
HPI: Denise Smith is a 53 y.o. female who  has a past medical history of Anemia, Family history of bladder cancer, Family history of breast cancer, Family history of kidney cancer, Family history of leukemia, Family history of skin cancer, and Headache(784.0).  she presents to [facility] today, 07/29/19,  for chief complaint of: Establish care  Musculoskeletal issues  Pressing issues today: Left shoulder pain: Ongoing issue, no history of injury.  Motion tends to make it worse, lifting tends to make it worse. Left knee pain: Not really in the joint itself but a bit below the joint.  She does fine when running for about a mile or so but much more than that will cause the pain to flareup. Right wrist/base of thumb pain: NO Numbness/tingling in the fingers.  Reports pain at the base more of the thumb, particularly with writing or fine motor movements, gripping.  Patient also has family history of breast cancer in sister and mother, she reports due for mammogram, she herself has never had any abnormal mammograms.  Not sure about family genetic testing.  Hx of migraines but fairly well-controlled on current medications.   Past medical, surgical, social and family history reviewed:  Patient Active Problem List   Diagnosis Date Noted  . Left foot pain 01/19/2018  . Genetic testing 12/16/2017  . Family history of breast cancer   . Family history of bladder cancer   . Family history of leukemia   . Family history of skin cancer   . Family history of kidney cancer   . Leiomyoma of uterus, unspecified 05/08/2013  . Pain in joint, lower leg 08/03/2008  . Metatarsalgia of left foot 08/03/2008  . PLANTAR FASCIITIS, RIGHT 04/29/2008  . FOOT PAIN, RIGHT 04/29/2008    Past Surgical History:  Procedure Laterality Date  . ABDOMINAL HYSTERECTOMY N/A 05/06/2013   Procedure: TOTAL ABDOMINAL HYSTERECTOMY;  Surgeon: Margarette Asal, MD;  Location: [facility];   Service: Gynecology;  Laterality: N/A;  . COLON RESECTION  1997  . LYSIS OF ADHESION N/A 05/06/2013   Procedure: LYSIS OF ADHESION;  Surgeon: Margarette Asal, MD;  Location: [facility];  Service: Gynecology;  Laterality: N/A;  . MOUTH SURGERY      Social History   Tobacco Use  . Smoking status: Former Smoker    Quit date: 04/22/2008    Years since quitting: 11.2  . Smokeless tobacco: Never Used  Substance Use Topics  . Alcohol use: No    Family History  Problem Relation Age of Onset  . Breast cancer Mother 12  . Bladder Cancer Father 49  . Kidney cancer Father        dx 76's, thinks it was a second primary rather than met  . Breast cancer Sister        dx 48's.   . Leukemia Maternal Grandfather   . Skin cancer Maternal Jon Gills        'a couple removed in his 12's'  . Heart disease Paternal Grandmother   . Breast cancer Other   . Breast cancer Other      Current medication list and allergy/intolerance information reviewed:    Current Outpatient Medications  Medication Sig Dispense Refill  . acetaminophen (TYLENOL) 325 MG tablet Take 2 tablets (650 mg total) by mouth every 6 (six) hours as needed for mild pain or moderate pain.    Marland Kitchen diclofenac sodium (VOLTAREN) 1 % GEL     . ibuprofen (ADVIL) 200 MG  tablet Take 4 tablets (800 mg total) by mouth every 8 (eight) hours as needed for pain. 30 tablet 2  . SUMAtriptan (IMITREX) 25 MG tablet Take 25 mg by mouth every 2 (two) hours as needed for migraine.    . topiramate (TOPAMAX) 50 MG tablet Take 50 mg by mouth daily.    Marland Kitchen VITAMIN E, TOPICAL, OIL Apply 1 application topically daily.     No current facility-administered medications for this visit.     No Known Allergies    Review of Systems:  Constitutional:  No  fever, no chills, No recent illness, No unintentional weight changes. No significant fatigue.   HEENT: No  headache, no vision change, no hearing change, No sore throat, No  sinus pressure  Cardiac: No  chest  pain, No  pressure, No palpitations, No  Orthopnea  Respiratory:  No  shortness of breath. No  Cough  Gastrointestinal: No  abdominal pain, No  nausea, No  vomiting,  No  blood in stool, No  diarrhea, No  constipation   Musculoskeletal: No new myalgia/arthralgia  Skin: No  Rash, No other wounds/concerning lesions  Genitourinary: No  incontinence, No  abnormal genital bleeding, No abnormal genital discharge  Hem/Onc: No  easy bruising/bleeding, No  abnormal lymph node  Endocrine: No cold intolerance,  No heat intolerance. No polyuria/polydipsia/polyphagia   Neurologic: No  weakness, No  dizziness, No  slurred speech/focal weakness/facial droop  Psychiatric: No  concerns with depression, No  concerns with anxiety, No sleep problems, No mood problems  Exam:  BP 114/80   Pulse 60   Ht 5' 7"  (1.702 m)   Wt 178 lb (80.7 kg)   LMP 04/21/2013   SpO2 100%   BMI 27.88 kg/m   Constitutional: VS see above. General Appearance: alert, well-developed, well-nourished, NAD  Eyes: Normal lids and conjunctive, non-icteric sclera  Neck: No masses, trachea midline. No thyroid enlargement. No tenderness/mass appreciated. No lymphadenopathy  Respiratory: Normal respiratory effort. no wheeze, no rhonchi, no rales  Cardiovascular: S1/S2 normal, no murmur, no rub/gallop auscultated. RRR. No lower extremity edema.]  Gastrointestinal: Nontender, no masses. No hepatomegaly, no splenomegaly. No hernia appreciated. Bowel sounds normal. Rectal exam deferred.   Musculoskeletal: Gait normal. No clubbing/cyanosis of digits.   Neurological: Normal balance/coordination. No tremor. No cranial nerve deficit on limited exam. Motor and sensation intact and symmetric. Cerebellar reflexes intact.   Skin: warm, dry, intact. No rash/ulcer. No concerning nevi or subq nodules on limited exam.    Psychiatric: Normal judgment/insight. Normal mood and affect. Oriented x3.    No results found for this or any  previous visit (from the past 72 hour(s)).  No results found.   ASSESSMENT/PLAN: The primary encounter diagnosis was Right wrist pain. Diagnoses of Chronic left shoulder pain, Chronic pain of left knee, Chronic migraine without aura without status migrainosus, not intractable, Menopausal symptom, Family history of breast cancer in mother, Family history of breast cancer in sister, Lipid screening, and Annual physical exam were also pertinent to this visit.   Labs ordered for future visit. Annual physical / preventive care was NOT performed or billed today.    Orders Placed This Encounter  Procedures  . DG Wrist Complete Right  . DG Shoulder Left  . DG Knee Complete 4 Views Left  . MM 3D SCREEN BREAST BILATERAL  . CBC  . COMPLETE METABOLIC PANEL WITH GFR  . Lipid panel    Meds ordered this encounter  Medications  . SUMAtriptan (IMITREX) 25 MG  tablet    Sig: Take 1 tablet (25 mg total) by mouth every 2 (two) hours as needed for migraine.    Dispense:  10 tablet    Refill:  3  . topiramate (TOPAMAX) 50 MG tablet    Sig: Take 1 tablet (50 mg total) by mouth daily.    Dispense:  90 tablet    Refill:  1    Patient Instructions  Plan:  For migraines, can continue current medications.  Refills are sent to pharmacy.  Please let me know if you would be interested in changing preventive medications from Topamax to something else!  For wrist, try wearing the splint as much as you are able.  Can continue the Voltaren gel.  We will get x-rays today  For the shoulder, will print some exercises/stretches to try at home but I think there might be an issue with the rotator cuff.  We will get an x-ray today and would like you to see sports medicine for this.  For the knee, will get x-ray today but it does not really seem consistent with arthritis or issue of the joint itself.  See printed home exercises and would also follow-up with sports medicine on this         Visit summary with  medication list and pertinent instructions was printed for patient to review. All questions at time of visit were answered - patient instructed to contact office with any additional concerns or updates. ER/RTC precautions were reviewed with the patient.      Please note: voice recognition software was used to produce this document, and typos may escape review. Please contact Dr. Sheppard Coil for any needed clarifications.     Follow-up plan: Return for VISIT WITH SPORTS MEDICINE FOR ORTHOPEDIC ISSUES: WRIST, SHOULDER, KNEE .

## 2019-08-02 ENCOUNTER — Encounter: Payer: Self-pay | Admitting: Osteopathic Medicine

## 2019-08-02 DIAGNOSIS — M25531 Pain in right wrist: Secondary | ICD-10-CM | POA: Insufficient documentation

## 2019-08-02 DIAGNOSIS — M12812 Other specific arthropathies, not elsewhere classified, left shoulder: Secondary | ICD-10-CM | POA: Insufficient documentation

## 2019-08-02 DIAGNOSIS — M25512 Pain in left shoulder: Secondary | ICD-10-CM | POA: Insufficient documentation

## 2019-08-02 DIAGNOSIS — N951 Menopausal and female climacteric states: Secondary | ICD-10-CM | POA: Insufficient documentation

## 2019-08-02 DIAGNOSIS — G43709 Chronic migraine without aura, not intractable, without status migrainosus: Secondary | ICD-10-CM | POA: Insufficient documentation

## 2019-08-02 MED ORDER — TOPIRAMATE 50 MG PO TABS: 50.0000 mg | ORAL_TABLET | Freq: Every day | ORAL | 1 refills | Status: DC

## 2019-08-02 MED ORDER — SUMATRIPTAN SUCCINATE 25 MG PO TABS: 25.0000 mg | ORAL_TABLET | ORAL | 3 refills | Status: DC | PRN

## 2019-08-03 MED FILL — TOPIRAMATE 50 MG TABLET: 50 | 90 days supply | Qty: 90 | Fill #0

## 2019-08-03 MED FILL — SUMATRIPTAN SUCC 25 MG TAB: 25 | 30 days supply | Qty: 10 | Fill #0

## 2019-08-13 MED FILL — TOPIRAMATE 50 MG TABLET: 50 | 90 days supply | Qty: 90 | Fill #0

## 2019-08-13 MED FILL — SUMATRIPTAN SUCC 25 MG TAB: 25 | 30 days supply | Qty: 10 | Fill #0

## 2019-08-25 ENCOUNTER — Other Ambulatory Visit: Payer: Self-pay

## 2019-08-25 ENCOUNTER — Encounter: Payer: Self-pay | Admitting: Sports Medicine

## 2019-08-25 ENCOUNTER — Ambulatory Visit (INDEPENDENT_AMBULATORY_CARE_PROVIDER_SITE_OTHER): Payer: No Typology Code available for payment source | Admitting: Sports Medicine

## 2019-08-25 DIAGNOSIS — M2242 Chondromalacia patellae, left knee: Secondary | ICD-10-CM

## 2019-08-25 DIAGNOSIS — M12812 Other specific arthropathies, not elsewhere classified, left shoulder: Secondary | ICD-10-CM

## 2019-08-25 MED ORDER — MELOXICAM 15 MG PO TABS: ORAL_TABLET | ORAL | 3 refills | Status: DC

## 2019-08-25 MED FILL — MELOXICAM 15 MG TABLET: 15 | 30 days supply | Qty: 30 | Fill #0

## 2019-08-25 NOTE — Progress Notes (Signed)
Subjective:    I'm seeing this patient as a consultation for: Dr. Emeterio Reeve  CC: Shoulder pain, knee pain  HPI: This is a pleasant 53 year old female, she has a long history of pain in her left shoulder and left knee, shoulder pain is localized over the deltoid, worse with abduction, external rotation, elevation, it does wake her at night.  She did some physical therapy a couple of years ago that seemed to work well.  In addition she has pain in her left knee, under the kneecap, worse going up and down stairs, squatting, with significant crepitus.  She has not done therapy for this and she is not really taking any prescription strength NSAIDs.  I reviewed the past medical history, family history, social history, surgical history, and allergies today and no changes were needed.  Please see the problem list section below in epic for further details.  Past Medical History: Past Medical History:  Diagnosis Date  . Anemia   . Family history of bladder cancer   . Family history of breast cancer   . Family history of kidney cancer   . Family history of leukemia   . Family history of skin cancer   . [id number](182.9)    Past Surgical History: Past Surgical History:  Procedure Laterality Date  . ABDOMINAL HYSTERECTOMY N/A 05/06/2013   Procedure: TOTAL ABDOMINAL HYSTERECTOMY;  Surgeon: Margarette Asal, MD;  Location: [facility];  Service: Gynecology;  Laterality: N/A;  . COLON RESECTION  1997  . LYSIS OF ADHESION N/A 05/06/2013   Procedure: LYSIS OF ADHESION;  Surgeon: Margarette Asal, MD;  Location: [facility];  Service: Gynecology;  Laterality: N/A;  . MOUTH SURGERY     Social History: Social History   Socioeconomic History  . Marital status: Married    Spouse name: Not on file  . Number of children: Not on file  . Years of education: Not on file  . Highest education level: Not on file  Occupational History  . Not on file  Social Needs  . Financial resource strain: Not on  file  . Food insecurity    Worry: Not on file    Inability: Not on file  . Transportation needs    Medical: Not on file    Non-medical: Not on file  Tobacco Use  . Smoking status: Former Smoker    Quit date: 04/22/2008    Years since quitting: 11.3  . Smokeless tobacco: Never Used  Substance and Sexual Activity  . Alcohol use: No  . Drug use: No  . Sexual activity: Yes    Partners: Male  Lifestyle  . Physical activity    Days per week: Not on file    Minutes per session: Not on file  . Stress: Not on file  Relationships  . Social Herbalist on phone: Not on file    Gets together: Not on file    Attends religious service: Not on file    Active member of club or organization: Not on file    Attends meetings of clubs or organizations: Not on file    Relationship status: Not on file  Other Topics Concern  . Not on file  Social History Narrative  . Not on file   Family History: Family History  Problem Relation Age of Onset  . Breast cancer Mother 76  . Bladder Cancer Father 41  . Kidney cancer Father        dx 24's, thinks it  was a second primary rather than met  . Breast cancer Sister        dx 10's.   . Leukemia Maternal Grandfather   . Skin cancer Maternal Jon Gills        'a couple removed in his 61's'  . Heart disease Paternal Grandmother   . Breast cancer Other   . Breast cancer Other    Allergies: No Known Allergies Medications: See med rec.  Review of Systems: No headache, visual changes, nausea, vomiting, diarrhea, constipation, dizziness, abdominal pain, skin rash, fevers, chills, night sweats, weight loss, swollen lymph nodes, body aches, joint swelling, muscle aches, chest pain, shortness of breath, mood changes, visual or auditory hallucinations.   Objective:   General: Well Developed, well nourished, and in no acute distress.  Neuro:  Extra-ocular muscles intact, able to move all 4 extremities, sensation grossly intact.  Deep tendon  reflexes tested were normal. Psych: Alert and oriented, mood congruent with affect. ENT:  Ears and nose appear unremarkable.  Hearing grossly normal. Neck: Unremarkable overall appearance, trachea midline.  No visible thyroid enlargement. Eyes: Conjunctivae and lids appear unremarkable.  Pupils equal and round. Skin: Warm and dry, no rashes noted.  Cardiovascular: Pulses palpable, no extremity edema. Left shoulder: Inspection reveals no abnormalities, atrophy or asymmetry. Palpation is normal with no tenderness over AC joint or bicipital groove. ROM is full in all planes. Rotator cuff strength normal throughout. Mildly positive Neer and Hawkin's tests, empty can. Speeds and Yergason's tests normal. No labral pathology noted with negative Obrien's, positive crank, negative clunk, and good stability. Normal scapular function observed. No painful arc and no drop arm sign. No apprehension sign Left knee: Normal to inspection with no erythema or effusion or obvious bony abnormalities. Tender to palpation of the patellar facets ROM normal in flexion and extension and lower leg rotation. Ligaments with solid consistent endpoints including ACL, PCL, LCL, MCL. Negative Mcmurray's and provocative meniscal tests. Non painful patellar compression, she does have crepitus. Patellar and quadriceps tendons unremarkable. Hamstring and quadriceps strength is normal.  Impression and Recommendations:   This case required medical decision making of moderate complexity.  Chondromalacia of patellofemoral joint, left Formal physical therapy, meloxicam, return to see me in a month, injection versus MRI if no better.  Rotator cuff arthropathy, left Physical therapy, meloxicam, return to see me in 4 weeks, MRI if no better. She does need to use up her [employer] account before the end of the year.   ___________________________________________ Gwen Her. Dianah Field, M.D., ABFM., CAQSM. Primary Care and Sports  [employer] [facility]  Adjunct Professor of [employer]

## 2019-08-25 NOTE — Assessment & Plan Note (Signed)
Physical therapy, meloxicam, return to see me in 4 weeks, MRI if no better. She does need to use up her [employer] account before the end of the year.

## 2019-08-25 NOTE — Assessment & Plan Note (Signed)
Formal physical therapy, meloxicam, return to see me in a month, injection versus MRI if no better.

## 2019-09-01 ENCOUNTER — Other Ambulatory Visit: Payer: Self-pay

## 2019-09-01 ENCOUNTER — Ambulatory Visit
Admission: RE | Admit: 2019-09-01 | Discharge: 2019-09-01 | Disposition: A | Discharge status: Home / Self Care | Payer: No Typology Code available for payment source | Source: Ambulatory Visit | Attending: Osteopathic Medicine | Admitting: Osteopathic Medicine

## 2019-09-03 ENCOUNTER — Other Ambulatory Visit: Payer: Self-pay | Admitting: Osteopathic Medicine

## 2019-09-03 ENCOUNTER — Other Ambulatory Visit: Payer: Self-pay

## 2019-09-03 ENCOUNTER — Ambulatory Visit: Payer: No Typology Code available for payment source

## 2019-09-03 ENCOUNTER — Ambulatory Visit
Admission: RE | Admit: 2019-09-03 | Discharge: 2019-09-03 | Disposition: A | Discharge status: Home / Self Care | Payer: No Typology Code available for payment source | Source: Ambulatory Visit | Attending: Osteopathic Medicine | Admitting: Osteopathic Medicine

## 2019-09-03 DIAGNOSIS — R928 Other abnormal and inconclusive findings on diagnostic imaging of breast: Secondary | ICD-10-CM

## 2019-09-16 ENCOUNTER — Ambulatory Visit: Payer: No Typology Code available for payment source | Admitting: Physical Therapy

## 2019-09-20 ENCOUNTER — Ambulatory Visit: Payer: No Typology Code available for payment source | Admitting: Physical Therapy

## 2019-09-20 ENCOUNTER — Ambulatory Visit: Payer: No Typology Code available for payment source | Admitting: Sports Medicine

## 2019-09-21 ENCOUNTER — Other Ambulatory Visit: Payer: Self-pay

## 2019-09-21 ENCOUNTER — Encounter: Payer: Self-pay | Admitting: Osteopathic Medicine

## 2019-09-21 ENCOUNTER — Ambulatory Visit (INDEPENDENT_AMBULATORY_CARE_PROVIDER_SITE_OTHER): Payer: No Typology Code available for payment source | Admitting: Osteopathic Medicine

## 2019-09-21 VITALS — BP 115/70 | Temp 97.1°F | Wt 177.0 lb

## 2019-09-21 DIAGNOSIS — Z9189 Other specified personal risk factors, not elsewhere classified: Secondary | ICD-10-CM

## 2019-09-21 DIAGNOSIS — Z20828 Contact with and (suspected) exposure to other viral communicable diseases: Secondary | ICD-10-CM

## 2019-09-21 DIAGNOSIS — Z20822 Contact with and (suspected) exposure to covid-19: Secondary | ICD-10-CM

## 2019-09-21 NOTE — Patient Instructions (Signed)
  Based on Centers for Disease Control and Prevention (CDC) guidelines, he/she is required to stay home and self-monitor until the test results are returned.  1. If results are positive, the patient will need to stay at home and self-isolate until the follow conditions are met: a. at least 10 days since symptoms onset or date of positive test results b. AND 3 days fever free without antipyretics (Tylenol or Ibuprofen) c. AND improvement in respiratory symptoms.  2. If results are negative, and the patient is asymptomatic, they may resume normal activities.  3. If the results of the test are negative and the patient develops symptoms, they should self-isolate at home, self-monitor, and notify their primary care provider. a. They should continue to self-isolate at home and self-monitor until they have met the CDC "Non-Test Criteria for Ending Self-Isolation" which includes all of the following: i. at least 10 days since symptoms onset or date of positive test results ii. AND 3 days fever free without antipyretics (Tylenol or Ibuprofen) iii. AND improvement in respiratory symptoms.   These measures are being implemented nationally out of an abundance of caution given the expanding outbreak of COVID-19 and are consistent with current CDC recommendations.

## 2019-09-21 NOTE — Progress Notes (Signed)
Virtual Visit via Video (App used: Doximity) Note  I connected with      Denise Smith on 09/21/19 at 7:26 AM  by a telemedicine application and verified that I am speaking with the correct person using two identifiers.  Patient is at home I am in office   I discussed the limitations of evaluation and management by telemedicine and the availability of in person appointments. The patient expressed understanding and agreed to proceed.  History of Present Illness: Denise Smith is a 53 y.o. female who would like to discuss COVI exposure    Was told by health at work to continue to report to work until testing on [place] (tomorrow 09/22/19). Exposed to COVID(+) patient Friday 09/17/19 - she works in interventional radiology, a patient desaturated, required some suctioning. Denise Smith was wearing a standard surgical mask (not N95), eye protection w/ goggles but not face shield, and gloves.   At this point, she is home and getting tested today. Reports "woozy" feeling and feeling of globus sensation in throat, congestion. Feels chest tightness but no serious SOB, no fever.          Observations/Objective: BP 115/70 Comment: reported  Temp (!) 97.1 F (36.2 C) (Oral) Comment: reported  Wt 177 lb (80.3 kg) Comment: reported  LMP 04/21/2013   BMI 27.72 kg/m  BP Readings from Last 3 Encounters:  09/21/19 115/70  08/25/19 114/68  07/29/19 114/80   Exam: Normal Speech.  NAD  Lab and Radiology Results No results found for this or any previous visit (from the past 72 hour(s)). No results found.     Assessment and Plan: 53 y.o. female with The primary encounter diagnosis was Close exposure to COVID-19 virus. A diagnosis of At increased risk of exposure to COVID-19 virus was also pertinent to this visit.  Hopefully testing now or tomorrow would probably have less chance of false negative.   Recommended to stay home from work until test results / depending on  symptoms.    PDMP not reviewed this encounter. No orders of the defined types were placed in this encounter.  No orders of the defined types were placed in this encounter.  Patient Instructions   Based on Centers for Disease Control and Prevention (CDC) guidelines, he/she is required to stay home and self-monitor until the test results are returned.  1. If results are positive, the patient will need to stay at home and self-isolate until the follow conditions are met: a. at least 10 days since symptoms onset or date of positive test results b. AND 3 days fever free without antipyretics (Tylenol or Ibuprofen) c. AND improvement in respiratory symptoms.  2. If results are negative, and the patient is asymptomatic, they may resume normal activities.  3. If the results of the test are negative and the patient develops symptoms, they should self-isolate at home, self-monitor, and notify their primary care provider. a. They should continue to self-isolate at home and self-monitor until they have met the CDC "Non-Test Criteria for Ending Self-Isolation" which includes all of the following: i. at least 10 days since symptoms onset or date of positive test results ii. AND 3 days fever free without antipyretics (Tylenol or Ibuprofen) iii. AND improvement in respiratory symptoms.   These measures are being implemented nationally out of an abundance of caution given the expanding outbreak of COVID-19 and are consistent with current CDC recommendations.         Instructions sent via MyChart. If MyChart  not available, pt was given option for info via personal e-mail w/ no guarantee of protected health info over unsecured e-mail communication, and MyChart sign-up instructions were sent to patient.   Follow Up Instructions: Return if symptoms worsen or fail to improve / depending on test results (please message [place] when you hear!).    I discussed the assessment and treatment plan with the  patient. The patient was provided an opportunity to ask questions and all were answered. The patient agreed with the plan and demonstrated an understanding of the instructions.   The patient was advised to call back or seek an in-person evaluation if any new concerns, if symptoms worsen or if the condition fails to improve as anticipated.  15 minutes of non-face-to-face time was provided during this encounter.      . . . . . . . . . . . . . Marland Kitchen                   Historical information moved to improve visibility of documentation.  Past Medical History:  Diagnosis Date  . Anemia   . Family history of bladder cancer   . Family history of breast cancer   . Family history of kidney cancer   . Family history of leukemia   . Family history of skin cancer   . DTHYHOOI(757.9)    Past Surgical History:  Procedure Laterality Date  . ABDOMINAL HYSTERECTOMY N/A 05/06/2013   Procedure: TOTAL ABDOMINAL HYSTERECTOMY;  Surgeon: Margarette Asal, MD;  Location: [facility];  Service: Gynecology;  Laterality: N/A;  . COLON RESECTION  1997  . LYSIS OF ADHESION N/A 05/06/2013   Procedure: LYSIS OF ADHESION;  Surgeon: Margarette Asal, MD;  Location: [facility];  Service: Gynecology;  Laterality: N/A;  . MOUTH SURGERY     Social History   Tobacco Use  . Smoking status: Former Smoker    Quit date: 04/22/2008    Years since quitting: 11.4  . Smokeless tobacco: Never Used  Substance Use Topics  . Alcohol use: No   family history includes Bladder Cancer (age of onset: 35) in her father; Breast cancer in her sister and other family members; Breast cancer (age of onset: 20) in her mother; Heart disease in her paternal grandmother; Kidney cancer in her father; Leukemia in her maternal grandfather; Skin cancer in her maternal grandfather.  Medications: Current Outpatient Medications  Medication Sig Dispense Refill  . acetaminophen (TYLENOL) 325 MG tablet Take 2 tablets (650 mg  total) by mouth every 6 (six) hours as needed for mild pain or moderate pain.    Marland Kitchen diclofenac sodium (VOLTAREN) 1 % GEL     . meloxicam (MOBIC) 15 MG tablet One tab PO qAM with breakfast for 2 weeks, then daily prn pain. 30 tablet 3  . SUMAtriptan (IMITREX) 25 MG tablet Take 1 tablet (25 mg total) by mouth every 2 (two) hours as needed for migraine. 10 tablet 3  . topiramate (TOPAMAX) 50 MG tablet Take 1 tablet (50 mg total) by mouth daily. 90 tablet 1  . VITAMIN E, TOPICAL, OIL Apply 1 application topically daily.     No current facility-administered medications for this visit.    No Known Allergies

## 2019-09-22 ENCOUNTER — Telehealth: Payer: Self-pay

## 2019-09-22 NOTE — Telephone Encounter (Signed)
Pt left a vm msg - she wanted provider to be aware her Covid testing was negative. She did attempt to inform provider via MyChart, however she had trouble with logging into her chart. No other inquiries mentioned in the vm.

## 2019-09-23 ENCOUNTER — Encounter: Payer: Self-pay | Admitting: Osteopathic Medicine

## 2019-09-23 NOTE — Telephone Encounter (Signed)
See my chart note.

## 2019-09-28 ENCOUNTER — Ambulatory Visit: Payer: No Typology Code available for payment source | Admitting: Sports Medicine

## 2019-09-30 ENCOUNTER — Ambulatory Visit: Payer: No Typology Code available for payment source | Attending: Sports Medicine | Admitting: Physical Therapy

## 2019-09-30 ENCOUNTER — Encounter: Payer: Self-pay | Admitting: Physical Therapy

## 2019-09-30 ENCOUNTER — Other Ambulatory Visit: Payer: Self-pay

## 2019-09-30 DIAGNOSIS — M25512 Pain in left shoulder: Secondary | ICD-10-CM | POA: Diagnosis present

## 2019-09-30 DIAGNOSIS — M25612 Stiffness of left shoulder, not elsewhere classified: Secondary | ICD-10-CM | POA: Diagnosis present

## 2019-09-30 DIAGNOSIS — M6281 Muscle weakness (generalized): Secondary | ICD-10-CM | POA: Insufficient documentation

## 2019-09-30 DIAGNOSIS — M25562 Pain in left knee: Secondary | ICD-10-CM | POA: Diagnosis present

## 2019-09-30 DIAGNOSIS — G8929 Other chronic pain: Secondary | ICD-10-CM | POA: Diagnosis present

## 2019-09-30 NOTE — Patient Instructions (Addendum)

## 2019-09-30 NOTE — Therapy (Addendum)
[facility] [address], [place], [place], [address] Phone: [phone]   Fax:  [phone]  Physical Therapy Evaluation  Patient Details  Name: Denise Smith MRN: [id number] Date of Birth: 08-Jan-1966 Referring Provider (PT): Dr. Helane Rima    Encounter Date: 09/30/2019  PT End of Session - 09/30/19 1053    Visit Number  1    Date for PT Re-Evaluation  11/25/19    Authorization Type  [facility]    PT Start Time  0920    PT Stop Time  1015    PT Time Calculation (min)  55 min    Activity Tolerance  Patient limited by pain       Past Medical History:  Diagnosis Date  . Anemia   . Family history of bladder cancer   . Family history of breast cancer   . Family history of kidney cancer   . Family history of leukemia   . Family history of skin cancer   . [id number])     Past Surgical History:  Procedure Laterality Date  . ABDOMINAL HYSTERECTOMY N/A 05/06/2013   Procedure: TOTAL ABDOMINAL HYSTERECTOMY;  Surgeon: Margarette Asal, MD;  Location: [facility];  Service: Gynecology;  Laterality: N/A;  . COLON RESECTION  1997  . LYSIS OF ADHESION N/A 05/06/2013   Procedure: LYSIS OF ADHESION;  Surgeon: Margarette Asal, MD;  Location: [facility];  Service: Gynecology;  Laterality: N/A;  . MOUTH SURGERY      There were no vitals filed for this visit.   Subjective Assessment - 09/30/19 0921    Subjective  I slipped on a trail root while running Sunday and injured my back.  Called for referral to add back to PT referral.   Referred to PT for left knee pain and left shoulder pain.  This is not my first time having knee pain.  Both knees hurt behind the knee cap.  Some pressure on the lateral side of the knee.  They feel weak.  No swelling.  Left shoulder pain on the top with reaching.    Pertinent History  2010 right mensicus repair;  PT for left shoulder 3 years ago including DN    Limitations  House hold  activities;Other (comment)    How long can you walk comfortably?  for a long time; works as a Corporate investment banker tests  x-ray shoulder normal    Patient Stated Goals  what do I need to keep knees healthy    Currently in Pain?  No/denies    Pain Score  0-No pain    Pain Location  Knee    Pain Orientation  Left;Right    Aggravating Factors   squatting to lift something; picking up dog; up and down stairs    Pain Relieving Factors  heat or ice    Multiple Pain Sites  Yes    Pain Score  2    Pain Location  Shoulder    Pain Orientation  Left;Anterior;Proximal    Pain Type  Chronic pain    Aggravating Factors   reaching overhead while reaching for a bag of saline;  pushing; wakes at night, hurts at rest    Pain Relieving Factors  some zingers all the way down the arm; no neck pain         [facility] PT Assessment - 09/30/19 0001      Assessment   Medical Diagnosis  left rotator cuff tendonopathy; left chondromalacia  Referring Provider (PT)  Dr. Helane Rima     Onset Date/Surgical Date  --   last few months    Hand Dominance  Right    Next MD Visit  to be scheduled     Prior Therapy  left shoulder 3 years ago.       Precautions   Precautions  None      Restrictions   Weight Bearing Restrictions  No      Balance Screen   Has the patient fallen in the past 6 months  Yes    How many times?  1   this past Sunday    Has the patient had a decrease in activity level because of a fear of falling?   No    Is the patient reluctant to leave their home because of a fear of falling?   No      Home Film/video editor residence    Home Access  Stairs to enter    Entrance Stairs-Number of Steps  6    Home Layout  Two level      Prior Function   Level of Independence  Independent    Vocation  Full time employment    Occupational psychologist    Leisure  trail run, exercise       Observation/Other Assessments   Focus on Therapeutic Outcomes (FOTO)   29%  limitation     Quick DASH   31.8%       AROM   Right Shoulder Flexion  180 Degrees    Right Shoulder ABduction  180 Degrees    Right Shoulder Internal Rotation  --   T6   Right Shoulder External Rotation  --   C7   Left Shoulder Flexion  155 Degrees   painful   Left Shoulder ABduction  139 Degrees   painful   Left Shoulder Internal Rotation  --   L5 pain   Left Shoulder External Rotation  --   C7   Right Knee Extension  0    Right Knee Flexion  140    Left Knee Extension  145    Left Knee Flexion  0      Strength   Right Shoulder Flexion  5/5    Right Shoulder Extension  5/5    Right Shoulder ABduction  5/5    Right Shoulder Internal Rotation  5/5    Right Shoulder External Rotation  5/5    Left Shoulder Flexion  3+/5    Left Shoulder Extension  4-/5    Left Shoulder ABduction  3+/5    Left Shoulder Internal Rotation  4-/5    Left Shoulder External Rotation  4-/5    Left Shoulder Horizontal ABduction  4/5    Left Shoulder Horizontal ADduction  4-/5    Left Hip ABduction  4-/5    Right Knee Flexion  4+/5    Right Knee Extension  4/5    Left Knee Flexion  4+/5    Left Knee Extension  4-/5      Flexibility   Hamstrings  bil 70 degrees       Palpation   Patella mobility  WNLs    Palpation comment  left upper trap tender points       Hawkins-Kennedy test   Findings  Positive    Side  Left      Empty Can test   Findings  Positive    Side  Left  Drop Arm test   Findings  Negative    Side  Left      Lateral Pull Sign    Findings  Negative      Step-up/Step Down    Findings  Positive    Comments  bil hip adduction/internal rotation       Patellofemoral Grind test (Clark's Sign)   Findings  Postive    Comments  bil       Ambulation/Gait   Gait Comments  very painful with sit to stand secondary to new onset LBP,  after several steps,gait becomes less antalgic                 Objective measurements completed on examination: See above  findings.      [facility] Adult PT Treatment/Exercise - 09/30/19 0001      Iontophoresis   Type of Iontophoresis  Dexamethasone    Location  left shoulder     Dose  4 mg/ml    Time  4-6 hour patch              PT Education - 09/30/19 1023    Education Details  ionto info    Person(s) Educated  Patient    Methods  Explanation;Demonstration;Handout    Comprehension  Returned demonstration;Verbalized understanding       PT Short Term Goals - 09/30/19 1413      PT SHORT TERM GOAL #1   Title  The patient will demonstrate understanding of basic self care of shoulder and knee to promote healing    Time  4    Period  Weeks    Status  New    Target Date  10/28/19      PT SHORT TERM GOAL #2   Title  The patient will demonstrate knowledge of basic HEP for shoulder and knee    Time  4    Period  Weeks    Status  New      PT SHORT TERM GOAL #3   Title  The patient will have improved Quick Dash score to 26% limitation    Time  4    Period  Weeks    Status  New      PT SHORT TERM GOAL #4   Title  The patient report a 25% reduction in left knee pain with partial squatting and ascending and descending stairs    Time  4    Period  Weeks    Status  New      PT SHORT TERM GOAL #5   Title  Left shoulder flexion to 160 degrees and abduction to 145 degrees needed for 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    Time  4    Period  Weeks    Status  New        PT [place] - 09/30/19 1418      PT [place] #1   Title  The patient will be independent with safe self progression of HEP    Time  8    Period  Weeks    Status  New    Target Date  11/25/19      PT LONG TERM GOAL #2   Title  The patient will have improved left shoulder strength to grossly 4/5 needed for lifting, 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    Time  8    Period  Weeks    Status  New      PT LONG TERM GOAL #3   Title  The patient will have grossly 4/5 strength in left  LE needed for greater ease with squatting and ascending/descending stairs    Time  8    Period  Weeks    Status  New      PT LONG TERM GOAL #4   Title  The patient have improved FOTO functional outcome score for the knee improved from 29% limitation to 22%    Time  8    Period  Weeks    Status  New      PT LONG TERM GOAL #5   Title  Quick DASH score improved from 31.8% limitation to 21%    Time  8    Period  Weeks    Status  New             Plan - 09/30/19 1053    Clinical Impression Statement  The patient is referred to PT for left knee pain and left rotator cuff arthropathy although she tripped on a root while trail running on Sunday and has acute LBP.  She has called her doctor's office to see if this diagnosis can be added to the PT referral.  She has a history of bil anterior knee pain with recent exacerbation of left > right knee pain with squatting, going up/down stairs (has to do one at a time or sideways).  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  Shoulder ROM is painful and moderately limited in all planes of motion.  +rotator cuff tests.  Bil hip abduction and knee extension strength 4-/5 but with full knee ROM.  Decreased patellofemoral control, hip adduction/internal rotation with step downs.  Crepitus bil knees.  She would benefit from PT to address left shoulder and left knee pain, ROM deficits and muscular weakness.  Will evaluate and treat low back upon receipt of referral.    Personal Factors and Comorbidities  Past/Current Experience;Comorbidity 1;Profession    Comorbidities  previous knee meniscus repair surgery; previous history of shoulder pain requiring PT; history of back pain; multi body regions affected    Examination-Activity Limitations  Reach Overhead;Stairs;Lift;Squat;Dressing;Carry    Examination-Participation Restrictions  Cleaning;Community Activity;Other     Stability/Clinical Decision Making  Evolving/Moderate complexity    Clinical Decision Making  Moderate    Rehab Potential  Good    PT Frequency  2x / week    PT Duration  8 weeks    PT Treatment/Interventions  ADLs/Self Care Home Management;Cryotherapy;Electrical Stimulation;Ultrasound;Moist Heat;Iontophoresis 4mg /ml Dexamethasone;Therapeutic activities;Therapeutic exercise;Neuromuscular re-education;Manual techniques;Patient/family education;Dry needling;Taping    PT Next Visit Plan  assess response to ionto #1 to left shoulder;  initiate HEP: HS stretch, supine shoulder assisted ROM or prone shoulder ROM; evaluate back if MD order received    Consulted and Agree with Plan of Care  Patient       Patient will benefit from skilled therapeutic intervention in order to improve the following deficits and impairments:  Decreased range of motion, Increased muscle spasms, Impaired UE functional use, Decreased activity tolerance, Pain, Decreased strength  Visit Diagnosis: Acute pain of left shoulder - Plan: PT plan of care cert/re-cert  Chronic pain of left knee - Plan: PT plan of care cert/re-cert  Stiffness of left shoulder, not elsewhere classified - Plan: PT plan of care cert/re-cert  Muscle weakness (generalized) - Plan: PT plan  of care cert/re-cert     Problem List Patient Active Problem List   Diagnosis Date Noted  . Right wrist pain 08/02/2019  . Rotator cuff arthropathy, left 08/02/2019  . Chronic migraine without aura without status migrainosus, not intractable 08/02/2019  . Menopausal symptom 08/02/2019  . Left foot pain 01/19/2018  . Genetic testing 12/16/2017  . Family history of breast cancer in sister   . Family history of bladder cancer   . Family history of leukemia   . Family history of skin cancer   . Family history of kidney cancer   . Leiomyoma of uterus, unspecified 05/08/2013  . Chondromalacia of patellofemoral joint, left 08/03/2008   Ruben Im,  PT 09/30/19 2:27 PM Phone: [phone] Fax: [phone] Alvera Singh 09/30/2019, 2:26 PM  [facility] [address] [place], [place], [address] Phone: [phone]   Fax:  [phone]  Name: Denise Smith MRN: [id number] Date of Birth: 12-15-1965

## 2019-10-07 ENCOUNTER — Encounter

## 2019-10-18 ENCOUNTER — Other Ambulatory Visit: Payer: Self-pay

## 2019-10-18 ENCOUNTER — Ambulatory Visit: Payer: No Typology Code available for payment source | Attending: Sports Medicine | Admitting: Physical Therapy

## 2019-10-18 ENCOUNTER — Encounter: Payer: Self-pay | Admitting: Physical Therapy

## 2019-10-18 DIAGNOSIS — M25612 Stiffness of left shoulder, not elsewhere classified: Secondary | ICD-10-CM

## 2019-10-18 DIAGNOSIS — M25562 Pain in left knee: Secondary | ICD-10-CM | POA: Insufficient documentation

## 2019-10-18 DIAGNOSIS — M6281 Muscle weakness (generalized): Secondary | ICD-10-CM | POA: Diagnosis present

## 2019-10-18 DIAGNOSIS — G8929 Other chronic pain: Secondary | ICD-10-CM

## 2019-10-18 DIAGNOSIS — M25512 Pain in left shoulder: Secondary | ICD-10-CM

## 2019-10-18 NOTE — Therapy (Signed)
[facility] [address], [place], [place], [address] Phone: [phone]   Fax:  [phone]  Physical Therapy Treatment  Patient Details  Name: Denise Smith MRN: [id number] Date of Birth: April 26, 1966 Referring Provider (PT): Dr. Helane Rima    Encounter Date: 10/18/2019  PT End of Session - 10/18/19 1659    Visit Number  2    Date for PT Re-Evaluation  11/25/19    Authorization Type  [facility]    PT Start Time  2025    PT Stop Time  1700    PT Time Calculation (min)  43 min    Activity Tolerance  Patient tolerated treatment well    Behavior During Therapy  [facility] for tasks assessed/performed       Past Medical History:  Diagnosis Date  . Anemia   . Family history of bladder cancer   . Family history of breast cancer   . Family history of kidney cancer   . Family history of leukemia   . Family history of skin cancer   . KYHCWCBJ(628.3)     Past Surgical History:  Procedure Laterality Date  . ABDOMINAL HYSTERECTOMY N/A 05/06/2013   Procedure: TOTAL ABDOMINAL HYSTERECTOMY;  Surgeon: Margarette Asal, MD;  Location: [place] [facility];  Service: Gynecology;  Laterality: N/A;  . COLON RESECTION  1997  . LYSIS OF ADHESION N/A 05/06/2013   Procedure: LYSIS OF ADHESION;  Surgeon: Margarette Asal, MD;  Location: [place] [facility];  Service: Gynecology;  Laterality: N/A;  . MOUTH SURGERY      There were no vitals filed for this visit.  Subjective Assessment - 10/18/19 1621    Subjective  Pt states the shoulder felt a little better after the patch.  The knee started hurting when I tried to do a spin class    Pertinent History  2010 right mensicus repair;  PT for left shoulder 3 years ago including DN    Limitations  House hold activities;Other (comment)    Currently in Pain?  No/denies                       [facility] Adult PT Treatment/Exercise - 10/18/19 0001      Exercises   Exercises  Shoulder      Lumbar  Exercises: Stretches   Active Hamstring Stretch  Right;Left;60 seconds    Quad Stretch  Right;Left;60 seconds    ITB Stretch  Right;Left;60 seconds      Shoulder Exercises: Supine   External Rotation  Strengthening;Both;15 reps;Theraband    Theraband Level (Shoulder External Rotation)  Level 1 (Yellow)    External Rotation Limitations  ER with cane    Flexion  AAROM;Left;10 reps   cane     Shoulder Exercises: Stretch   Table Stretch - Flexion  10 seconds;1 rep    Table Stretch - External Rotation  5 reps;10 seconds      Iontophoresis   Type of Iontophoresis  Dexamethasone    Location  left shoulder     Dose  4 mg/ml - #2    Time  4-6 hour patch       Manual Therapy   Manual Therapy  Joint mobilization;Soft tissue mobilization    Joint Mobilization  AP GH joint mobs    Soft tissue mobilization  anterior deltoid and pecs Lt shoulder             PT Education - 10/18/19 1659    Education Details  Access Code: [email or web address]    Person(s) Educated  Patient    Methods  Explanation;Demonstration;Verbal cues;Handout    Comprehension  Verbalized understanding;Returned demonstration       PT Short Term Goals - 09/30/19 1413      PT SHORT TERM GOAL #1   Title  The patient will demonstrate understanding of basic self care of shoulder and knee to promote healing    Time  4    Period  Weeks    Status  New    Target Date  10/28/19      PT SHORT TERM GOAL #2   Title  The patient will demonstrate knowledge of basic HEP for shoulder and knee    Time  4    Period  Weeks    Status  New      PT SHORT TERM GOAL #3   Title  The patient will have improved Quick Dash score to 26% limitation    Time  4    Period  Weeks    Status  New      PT SHORT TERM GOAL #4   Title  The patient report a 25% reduction in left knee pain with partial squatting and ascending and descending stairs    Time  4    Period  Weeks    Status  New      PT SHORT TERM GOAL #5   Title  Left shoulder flexion  to 160 degrees and abduction to 145 degrees needed for 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    Time  4    Period  Weeks    Status  New        PT [place] - 09/30/19 1418      PT [place] #1   Title  The patient will be independent with safe self progression of HEP    Time  8    Period  Weeks    Status  New    Target Date  11/25/19      PT LONG TERM GOAL #2   Title  The patient will have improved left shoulder strength to grossly 4/5 needed for lifting, 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    Time  8    Period  Weeks    Status  New      PT [place] #3   Title  The patient will have grossly 4/5 strength in left LE needed for greater ease with squatting and ascending/descending stairs    Time  8    Period  Weeks    Status  New      PT LONG TERM GOAL #4   Title  The patient have improved FOTO functional outcome score for the knee improved from 29% limitation to 22%    Time  8    Period  Weeks    Status  New      PT LONG TERM GOAL #5   Title  Quick DASH score improved from 31.8% limitation to 21%    Time  8    Period  Weeks    Status  New            Plan - 10/18/19 1700    Clinical Impression Statement  Today's session focused on increased muscle length and ROM.  Pt had more AAROM after performing exercises and manual treatment.  Pt had no increased pain after treatment and reported less tenderness.  Pt was issued HEP today and given ionto #2 . No goals met as today was the initial treatment since the evaluation.  Pt will benefit from skilled PT according to POC.    PT Treatment/Interventions  ADLs/Self Care Home Management;Cryotherapy;Electrical Stimulation;Ultrasound;Moist Heat;Iontophoresis 42m/ml Dexamethasone;Therapeutic activities;Therapeutic exercise;Neuromuscular re-education;Manual techniques;Patient/family education;Dry needling;Taping    PT Next Visit Plan  assess response to ionto 2  to left shoulder and intial  HEP, progress ROM; GRavennamobs and STM to pecs as needed, gentle RTC and quad strength as tolerated    PT Home Exercise Plan  Access Code: [email or web address]    Consulted and Agree with Plan of Care  Patient       Patient will benefit from skilled therapeutic intervention in order to improve the following deficits and impairments:  Decreased range of motion, Increased muscle spasms, Impaired UE functional use, Decreased activity tolerance, Pain, Decreased strength  Visit Diagnosis: Acute pain of left shoulder  Chronic pain of left knee  Stiffness of left shoulder, not elsewhere classified  Muscle weakness (generalized)     Problem List Patient Active Problem List   Diagnosis Date Noted  . Right wrist pain 08/02/2019  . Rotator cuff arthropathy, left 08/02/2019  . Chronic migraine without aura without status migrainosus, not intractable 08/02/2019  . Menopausal symptom 08/02/2019  . Left foot pain 01/19/2018  . Genetic testing 12/16/2017  . Family history of breast cancer in sister   . Family history of bladder cancer   . Family history of leukemia   . Family history of skin cancer   . Family history of kidney cancer   . Leiomyoma of uterus, unspecified 05/08/2013  . Chondromalacia of patellofemoral joint, left 08/03/2008    JJule Ser PT 10/18/2019, 5:36 PM  [facility] [address] [place] [address]Phone: [phone]  Fax:  [phone] Name: SAubryanna NesheimMRN: [id number] of Birth: 2Jun 29, 1967

## 2019-10-18 NOTE — Patient Instructions (Signed)
Access Code: [email or web address]  URL: [email or web address]  Date: 10/18/2019  Prepared by: Jari Favre   Exercises Standing Hamstring Stretch with Step - 3 reps - 1 sets - 30 sec hold - 1x daily - 7x weekly Quadriceps Stretch with Chair - 3 reps - 1 sets - 30 sec hold - 1x daily - 7x weekly Standing ITB Stretch - 3 reps - 1 sets - 30 sec hold - 1x daily - 7x weekly Supine Shoulder External Rotation on Foam Roll with Theraband - 10 reps - 2 sets - 1x daily - 7x weekly Seated Shoulder External Rotation PROM on Table - 10 reps - 1 sets - 5 sec hold - 1x daily - 7x weekly Seated Shoulder Flexion Slide at Table Top with Forearm in Neutral - 10 reps - 1 sets - 5 sec hold - 1x daily - 7x weekly

## 2019-10-20 ENCOUNTER — Other Ambulatory Visit: Payer: Self-pay

## 2019-10-20 ENCOUNTER — Ambulatory Visit: Payer: No Typology Code available for payment source | Admitting: Physical Therapy

## 2019-10-20 ENCOUNTER — Encounter: Payer: Self-pay | Admitting: Physical Therapy

## 2019-10-20 DIAGNOSIS — M25612 Stiffness of left shoulder, not elsewhere classified: Secondary | ICD-10-CM

## 2019-10-20 DIAGNOSIS — M25562 Pain in left knee: Secondary | ICD-10-CM

## 2019-10-20 DIAGNOSIS — M25512 Pain in left shoulder: Secondary | ICD-10-CM

## 2019-10-20 DIAGNOSIS — G8929 Other chronic pain: Secondary | ICD-10-CM

## 2019-10-20 DIAGNOSIS — M6281 Muscle weakness (generalized): Secondary | ICD-10-CM

## 2019-10-20 NOTE — Therapy (Signed)
[facility] [address] [place], [place], [address] Phone: [phone]   Fax:  [phone]  Physical Therapy Treatment  Patient Details  Name: Denise Smith MRN: [id number] Date of Birth: June 03, 1966 Referring Provider (PT): Dr. Helane Rima    Encounter Date: 10/20/2019  PT End of Session - 10/20/19 1622    Visit Number  3    Date for PT Re-Evaluation  11/25/19    Authorization Type  [facility]    PT Start Time  [id number]    PT Stop Time  1700    PT Time Calculation (min)  43 min    Activity Tolerance  Patient tolerated treatment well    Behavior During Therapy  [facility] for tasks assessed/performed       Past Medical History:  Diagnosis Date  . Anemia   . Family history of bladder cancer   . Family history of breast cancer   . Family history of kidney cancer   . Family history of leukemia   . Family history of skin cancer   . [id number])     Past Surgical History:  Procedure Laterality Date  . ABDOMINAL HYSTERECTOMY N/A 05/06/2013   Procedure: TOTAL ABDOMINAL HYSTERECTOMY;  Surgeon: Margarette Asal, MD;  Location: [place] [facility];  Service: Gynecology;  Laterality: N/A;  . COLON RESECTION  1997  . LYSIS OF ADHESION N/A 05/06/2013   Procedure: LYSIS OF ADHESION;  Surgeon: Margarette Asal, MD;  Location: [place] [facility];  Service: Gynecology;  Laterality: N/A;  . MOUTH SURGERY      There were no vitals filed for this visit.  Subjective Assessment - 10/20/19 1620    Subjective  Pt states shoulder is about the same and has pain reaching out, up, and back.  Pt states the knee is feeling okay.    How long can you walk comfortably?  for a long time; works as a Marine scientist    Patient Stated Goals  what do I need to keep knees healthy    Currently in Pain?  No/denies                       [facility] Adult PT Treatment/Exercise - 10/20/19 0001      Shoulder Exercises: Supine   External Rotation   Strengthening;Both;Theraband;20 reps    Theraband Level (Shoulder External Rotation)  Level 1 (Yellow)    Flexion  Strengthening;Both;20 reps;Weights    Flexion Limitations  cane with 2lb press up    Other Supine Exercises  small circles 20x both ways      Shoulder Exercises: Prone   Retraction  Strengthening;Left;20 reps      Shoulder Exercises: Sidelying   External Rotation  Strengthening;Left;20 reps      Shoulder Exercises: Stretch   Other Shoulder Stretches  UE ranger - flexion - 2 min level 32    Other Shoulder Stretches  supine flexion 10x 5 sec , with towel roll for thoracic extension 5 x 5 sec      Iontophoresis   Type of Iontophoresis  Dexamethasone    Location  left shoulder     Dose  4 mg/ml - #3    Time  4-6 hour patch       Manual Therapy   Joint Mobilization  AP GH joint mobs    Soft tissue mobilization  anterior deltoid and pecs Lt shoulder               PT Short  Term Goals - 09/30/19 1413      PT SHORT TERM GOAL #1   Title  The patient will demonstrate understanding of basic self care of shoulder and knee to promote healing    Time  4    Period  Weeks    Status  New    Target Date  10/28/19      PT SHORT TERM GOAL #2   Title  The patient will demonstrate knowledge of basic HEP for shoulder and knee    Time  4    Period  Weeks    Status  New      PT SHORT TERM GOAL #3   Title  The patient will have improved Quick Dash score to 26% limitation    Time  4    Period  Weeks    Status  New      PT SHORT TERM GOAL #4   Title  The patient report a 25% reduction in left knee pain with partial squatting and ascending and descending stairs    Time  4    Period  Weeks    Status  New      PT SHORT TERM GOAL #5   Title  Left shoulder flexion to 160 degrees and abduction to 145 degrees needed for 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    Time  4    Period  Weeks    Status  New        PT [place] - 09/30/19 1418       PT [place] #1   Title  The patient will be independent with safe self progression of HEP    Time  8    Period  Weeks    Status  New    Target Date  11/25/19      PT LONG TERM GOAL #2   Title  The patient will have improved left shoulder strength to grossly 4/5 needed for lifting, 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    Time  8    Period  Weeks    Status  New      PT [place] #3   Title  The patient will have grossly 4/5 strength in left LE needed for greater ease with squatting and ascending/descending stairs    Time  8    Period  Weeks    Status  New      PT LONG TERM GOAL #4   Title  The patient have improved FOTO functional outcome score for the knee improved from 29% limitation to 22%    Time  8    Period  Weeks    Status  New      PT LONG TERM GOAL #5   Title  Quick DASH score improved from 31.8% limitation to 21%    Time  8    Period  Weeks    Status  New            Plan - 10/20/19 1923    Clinical Impression Statement  Pt states her knee is feeling pretty good.  Mostly her shoulder is bothering her so PT will focus on shoulder unless any changes with patient's status.  Pt was able to incorporate more strenghtening.  No increased pain reported after treatment.  Pt was monitored for pain throughout.  She had some anterior impingement when lying prone so discontinued other prone exercises.  Pt  will benefit from skilled PT to continue to work on progressing shoulder strength and pain free ROM    Comorbidities  previous knee meniscus repair surgery; previous history of shoulder pain requiring PT; history of back pain; multi body regions affected    PT Treatment/Interventions  ADLs/Self Care Home Management;Cryotherapy;Electrical Stimulation;Ultrasound;Moist Heat;Iontophoresis 4mg /ml Dexamethasone;Therapeutic activities;Therapeutic exercise;Neuromuscular re-education;Manual techniques;Patient/family education;Dry needling;Taping    PT Next Visit Plan   progress ROM; [place] mobs and STM to pecs as needed, gentle RTC and scapular strength as tolerated    PT Home Exercise Plan  Access Code: [email or web address]    Consulted and Agree with Plan of Care  Patient       Patient will benefit from skilled therapeutic intervention in order to improve the following deficits and impairments:  Decreased range of motion, Increased muscle spasms, Impaired UE functional use, Decreased activity tolerance, Pain, Decreased strength  Visit Diagnosis: Acute pain of left shoulder  Chronic pain of left knee  Stiffness of left shoulder, not elsewhere classified  Muscle weakness (generalized)     Problem List Patient Active Problem List   Diagnosis Date Noted  . Right wrist pain 08/02/2019  . Rotator cuff arthropathy, left 08/02/2019  . Chronic migraine without aura without status migrainosus, not intractable 08/02/2019  . Menopausal symptom 08/02/2019  . Left foot pain 01/19/2018  . Genetic testing 12/16/2017  . Family history of breast cancer in sister   . Family history of bladder cancer   . Family history of leukemia   . Family history of skin cancer   . Family history of kidney cancer   . Leiomyoma of uterus, unspecified 05/08/2013  . Chondromalacia of patellofemoral joint, left 08/03/2008    Jule Ser, PT 10/20/2019, 7:28 PM  [facility] [address] [place], [place], [address] Phone: [phone]   Fax:  [phone]  Name: Denise Smith MRN: [id number] Date of Birth: 22-Aug-1966

## 2019-10-26 ENCOUNTER — Ambulatory Visit: Payer: No Typology Code available for payment source | Admitting: Physical Therapy

## 2019-11-02 ENCOUNTER — Other Ambulatory Visit: Payer: Self-pay

## 2019-11-02 ENCOUNTER — Ambulatory Visit: Payer: No Typology Code available for payment source | Admitting: Physical Therapy

## 2019-11-02 ENCOUNTER — Encounter: Payer: Self-pay | Admitting: Physical Therapy

## 2019-11-02 DIAGNOSIS — M25512 Pain in left shoulder: Secondary | ICD-10-CM

## 2019-11-02 DIAGNOSIS — M25612 Stiffness of left shoulder, not elsewhere classified: Secondary | ICD-10-CM

## 2019-11-02 DIAGNOSIS — M6281 Muscle weakness (generalized): Secondary | ICD-10-CM

## 2019-11-02 NOTE — Therapy (Signed)
[facility] [address], [place], [place], [address] Phone: [phone]   Fax:  [phone]  Physical Therapy Treatment  Patient Details  Name: Denise Smith MRN: [id number] Date of Birth: 09/05/1966 Referring Provider (PT): Dr. Helane Rima    Encounter Date: 11/02/2019  PT End of Session - 11/02/19 1720    Visit Number  4    Date for PT Re-Evaluation  11/25/19    Authorization Type  [facility]    PT Start Time  1615    PT Stop Time  1655    PT Time Calculation (min)  40 min    Activity Tolerance  Patient tolerated treatment well       Past Medical History:  Diagnosis Date  . Anemia   . Family history of bladder cancer   . Family history of breast cancer   . Family history of kidney cancer   . Family history of leukemia   . Family history of skin cancer   . JOINOMVE(720.9)     Past Surgical History:  Procedure Laterality Date  . ABDOMINAL HYSTERECTOMY N/A 05/06/2013   Procedure: TOTAL ABDOMINAL HYSTERECTOMY;  Surgeon: Margarette Asal, MD;  Location: [place] [facility];  Service: Gynecology;  Laterality: N/A;  . COLON RESECTION  1997  . LYSIS OF ADHESION N/A 05/06/2013   Procedure: LYSIS OF ADHESION;  Surgeon: Margarette Asal, MD;  Location: [place] [facility];  Service: Gynecology;  Laterality: N/A;  . MOUTH SURGERY      There were no vitals filed for this visit.  Subjective Assessment - 11/02/19 1621    Subjective  The pain is about the same but pain at rest is better in shoulder.  Knee is better. Getting a new job 2/1.  Had covid vaccine on left shoulder.    Pertinent History  2010 right mensicus repair;  PT for left shoulder 3 years ago including DN    Currently in Pain?  No/denies    Pain Score  0-No pain    Pain Orientation  Left         [facility] PT Assessment - 11/02/19 0001      AROM   Left Shoulder Flexion  142 Degrees    Left Shoulder ABduction  135 Degrees                   [facility] Adult  PT Treatment/Exercise - 11/02/19 0001      Shoulder Exercises: Supine   Protraction  AROM;Left;10 reps      Shoulder Exercises: Prone   Extension  AROM;Left;10 reps    Horizontal ABduction 1  AROM;Left;10 reps    Horizontal ABduction 2  AROM;Left;10 reps      Shoulder Exercises: ROM/Strengthening   Ranger  on 2nd step UE Ranger 15x       Iontophoresis   Type of Iontophoresis  Dexamethasone    Location  left shoulder     Dose  4 mg/ml - #4    Time  4-6 hour patch       Manual Therapy   Manual therapy comments  scapular mobs medial/lateral glides     Joint Mobilization  AP and inferior GH joint mobs grade 3/4 with and without mobs with movement     Kinesiotex  Facilitate Muscle      Kinesiotix   Facilitate Muscle   deltoid 3 strips                PT Short Term Goals -  11/02/19 1725      PT SHORT TERM GOAL #1   Title  The patient will demonstrate understanding of basic self care of shoulder and knee to promote healing    Status  Achieved      PT SHORT TERM GOAL #2   Title  The patient will demonstrate knowledge of basic HEP for shoulder and knee    Status  Achieved      PT SHORT TERM GOAL #3   Title  The patient will have improved Quick Dash score to 26% limitation    Time  4    Period  Weeks    Status  On-going      PT SHORT TERM GOAL #4   Title  The patient report a 25% reduction in left knee pain with partial squatting and ascending and descending stairs    Status  Achieved      PT SHORT TERM GOAL #5   Title  Left shoulder flexion to 160 degrees and abduction to 145 degrees needed for 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    Time  4    Period  Weeks    Status  On-going        PT Long Term Goals - 09/30/19 1418      PT [place] #1   Title  The patient will be independent with safe self progression of HEP    Time  8    Period  Weeks    Status  New    Target Date  11/25/19      PT LONG TERM GOAL #2   Title  The  patient will have improved left shoulder strength to grossly 4/5 needed for lifting, 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    Time  8    Period  Weeks    Status  New      PT [place] #3   Title  The patient will have grossly 4/5 strength in left LE needed for greater ease with squatting and ascending/descending stairs    Time  8    Period  Weeks    Status  New      PT LONG TERM GOAL #4   Title  The patient have improved FOTO functional outcome score for the knee improved from 29% limitation to 22%    Time  8    Period  Weeks    Status  New      PT LONG TERM GOAL #5   Title  Quick DASH score improved from 31.8% limitation to 21%    Time  8    Period  Weeks    Status  New            Plan - 11/02/19 1721    Clinical Impression Statement  The patient continues to have high shoulder irritability with primary complaint of superior shoulder pain.  No improvements in shoulder ROM since initial eval secondary to pain limitations.  Therapist closely monitoring response and modifying ex's as needed for pain.  Discussed holding band external rotation secondary to symptom irritability.  Partial short term goals met (mostly knee goals).    Comorbidities  previous knee meniscus repair surgery; previous history of shoulder pain requiring PT; history of back pain; multi body regions affected    Rehab Potential  Good    PT Frequency  2x / week    PT Duration  8 weeks  PT Treatment/Interventions  ADLs/Self Care Home Management;Cryotherapy;Electrical Stimulation;Ultrasound;Moist Heat;Iontophoresis 54m/ml Dexamethasone;Therapeutic activities;Therapeutic exercise;Neuromuscular re-education;Manual techniques;Patient/family education;Dry needling;Taping    PT Next Visit Plan  progress ROM; GBurasmobs and STM to pecs as needed, gentle RTC and scapular strength as tolerated; do Quick DASH for STGs   PT Home Exercise Plan  Access Code: [email or web address]       Patient will benefit from skilled  therapeutic intervention in order to improve the following deficits and impairments:  Decreased range of motion, Increased muscle spasms, Impaired UE functional use, Decreased activity tolerance, Pain, Decreased strength  Visit Diagnosis: Acute pain of left shoulder  Stiffness of left shoulder, not elsewhere classified  Muscle weakness (generalized)     Problem List Patient Active Problem List   Diagnosis Date Noted  . Right wrist pain 08/02/2019  . Rotator cuff arthropathy, left 08/02/2019  . Chronic migraine without aura without status migrainosus, not intractable 08/02/2019  . Menopausal symptom 08/02/2019  . Left foot pain 01/19/2018  . Genetic testing 12/16/2017  . Family history of breast cancer in sister   . Family history of bladder cancer   . Family history of leukemia   . Family history of skin cancer   . Family history of kidney cancer   . Leiomyoma of uterus, unspecified 05/08/2013  . Chondromalacia of patellofemoral joint, left 08/03/2008   SRuben Im PT 11/02/19 5:28 PM Phone: [phone]: [phone] Singh1/19/2021, 5:28 PM  [facility] [address] [place] [address]Phone: [phone]  Fax:  [phone] Name: SSoraya PaquetteMRN: [id number] of Birth: 01/08/1966/02/17

## 2019-11-03 MED FILL — TOPIRAMATE 50 MG TABLET: 50 | 90 days supply | Qty: 90 | Fill #1

## 2019-11-03 MED FILL — DICLOFENAC SODIUM 1 % GEL: 1 | 8 days supply | Qty: 100 | Fill #0

## 2019-11-04 ENCOUNTER — Ambulatory Visit: Payer: No Typology Code available for payment source | Admitting: Physical Therapy

## 2019-11-08 MED FILL — SUMATRIPTAN SUCC 25 MG TAB: 25 | 30 days supply | Qty: 10 | Fill #1

## 2019-11-09 ENCOUNTER — Ambulatory Visit: Payer: No Typology Code available for payment source | Admitting: Physical Therapy

## 2019-11-09 ENCOUNTER — Other Ambulatory Visit: Payer: Self-pay

## 2019-11-09 DIAGNOSIS — M25512 Pain in left shoulder: Secondary | ICD-10-CM | POA: Diagnosis not present

## 2019-11-09 DIAGNOSIS — G8929 Other chronic pain: Secondary | ICD-10-CM

## 2019-11-09 DIAGNOSIS — M25562 Pain in left knee: Secondary | ICD-10-CM

## 2019-11-09 DIAGNOSIS — M6281 Muscle weakness (generalized): Secondary | ICD-10-CM

## 2019-11-09 DIAGNOSIS — M25612 Stiffness of left shoulder, not elsewhere classified: Secondary | ICD-10-CM

## 2019-11-09 NOTE — Therapy (Signed)
[facility] [address] [place], [place], [address] Phone: [phone]   Fax:  [phone]  Physical Therapy Treatment/Discharge Summary   Patient Details  Name: Denise Smith MRN: [id number] Date of Birth: Sep 08, 1966 Referring Provider (PT): Dr. Helane Rima    Encounter Date: 11/09/2019  PT End of Session - 11/09/19 1721    Visit Number  5    Date for PT Re-Evaluation  11/25/19    Authorization Type  [facility]    PT Start Time  1616    PT Stop Time  1655    PT Time Calculation (min)  39 min    Activity Tolerance  Patient tolerated treatment well       Past Medical History:  Diagnosis Date  . Anemia   . Family history of bladder cancer   . Family history of breast cancer   . Family history of kidney cancer   . Family history of leukemia   . Family history of skin cancer   . ZSWFUXNA(355.7)     Past Surgical History:  Procedure Laterality Date  . ABDOMINAL HYSTERECTOMY N/A 05/06/2013   Procedure: TOTAL ABDOMINAL HYSTERECTOMY;  Surgeon: Margarette Asal, MD;  Location: [facility];  Service: Gynecology;  Laterality: N/A;  . COLON RESECTION  1997  . LYSIS OF ADHESION N/A 05/06/2013   Procedure: LYSIS OF ADHESION;  Surgeon: Margarette Asal, MD;  Location: [facility];  Service: Gynecology;  Laterality: N/A;  . MOUTH SURGERY      There were no vitals filed for this visit.  Subjective Assessment - 11/09/19 1613    Subjective  Today is my last day b/c I don't know what my new insurance will be.  I do sleep better now though.  I'll follow up with Dr. Darene Lamer.  My knee is doing fine.    Pertinent History  2010 right mensicus repair;  PT for left shoulder 3 years ago including DN    Currently in Pain?  No/denies    Pain Score  0-No pain    Pain Location  Shoulder    Pain Orientation  Left         [facility] PT Assessment - 11/09/19 0001      Observation/Other Assessments   Focus on Therapeutic Outcomes (FOTO)    22.7% limitation     Quick DASH   16%       AROM   Left Shoulder Flexion  156 Degrees    Left Shoulder ABduction  125 Degrees    Left Shoulder Internal Rotation  --   L2   Left Shoulder External Rotation  --   T1   Right Knee Extension  0    Right Knee Flexion  140    Left Knee Extension  145    Left Knee Flexion  0      Strength   Left Shoulder Flexion  4-/5    Left Shoulder ABduction  4-/5      Drop Arm test   Findings  Negative                   [facility] Adult PT Treatment/Exercise - 11/09/19 0001      Knee/Hip Exercises: Aerobic   Stationary Bike  8 min L2 while discussing progress       Shoulder Exercises: Prone   Extension  Strengthening;Left;10 reps;Weights    Extension Weight (lbs)  3    Horizontal ABduction 1  AROM;Left;10 reps    Horizontal  ABduction 2  AROM;Left;10 reps    Other Prone Exercises  row 3# 10x       Shoulder Exercises: Standing   Extension  Strengthening;Left;10 reps;Theraband    Theraband Level (Shoulder Extension)  Level 3 (Green)    Row  Strengthening;Left;10 reps      Shoulder Exercises: ROM/Strengthening   Ranger  on wall L0 and L10 10x each       Iontophoresis   Type of Iontophoresis  Dexamethasone    Location  left shoulder     Dose  4 mg/ml - #5    Time  4-6 hour patch              PT Education - 11/09/19 1653    Education Details  Access Code: [email or web address] green band rows and extensions; prone ITY    Person(s) Educated  Patient    Methods  Explanation;Demonstration;Handout    Comprehension  Returned demonstration;Verbalized understanding       PT Short Term Goals - 11/09/19 1726      PT SHORT TERM GOAL #1   Title  The patient will demonstrate understanding of basic self care of shoulder and knee to promote healing    Status  Achieved      PT SHORT TERM GOAL #2   Title  The patient will demonstrate knowledge of basic HEP for shoulder and knee    Status  Achieved      PT SHORT TERM GOAL #3   Title  The  patient will have improved Quick Dash score to 26% limitation    Status  Achieved      PT SHORT TERM GOAL #4   Title  The patient report a 25% reduction in left knee pain with partial squatting and ascending and descending stairs    Status  Achieved      PT SHORT TERM GOAL #5   Title  Left shoulder flexion to 160 degrees and abduction to 145 degrees needed for 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    Status  Not Met        PT Long Term Goals - 11/09/19 1726      PT [place] #1   Title  The patient will be independent with safe self progression of HEP    Status  Achieved      PT LONG TERM GOAL #2   Title  The patient will have improved left shoulder strength to grossly 4/5 needed for lifting, 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    Status  Partially Met      PT [place] #3   Title  The patient will have grossly 4/5 strength in left LE needed for greater ease with squatting and ascending/descending stairs    Status  Achieved      PT LONG TERM GOAL #4   Title  The patient have improved FOTO functional outcome score for the knee improved from 29% limitation to 22%    Status  Achieved      PT LONG TERM GOAL #5   Title  Quick DASH score improved from 31.8% limitation to 21%    Status  Partially Met            Plan - 11/09/19 1639    Clinical Impression Statement  The patient requests discharge from PT secondary to upcoming job change and health insurance change.  She reports significant improvement in knee pain with FOTO score improving  from 29% limitation to only 16%.  She has full ROM and strength.  Her left shoulder pain continues especially with reaching out to the side but she reports she is sleeping better.  Her Quick DASH score has improved to 22%.  Her flexion and abduction are painful and limited in ROM however her shoulder internal and external rotation has improved.  Strength has improved as well but still limited.  She has been  instructed in a comprehensive HEP to improve ROM and periscapular strengthening within pain limits.  Will discharge from PT at this time with partial goals met at her request.    PT Home Exercise Plan  Access Code: [email or web address]       Patient will benefit from skilled therapeutic intervention in order to improve the following deficits and impairments:     Visit Diagnosis: Acute pain of left shoulder  Stiffness of left shoulder, not elsewhere classified  Muscle weakness (generalized)  Chronic pain of left knee    PHYSICAL THERAPY DISCHARGE SUMMARY  Visits from Start of Care: 5  Current functional level related to goals / functional outcomes: See clinical impressions above   Remaining deficits: As above   Education / Equipment: HEP  Plan: Patient agrees to discharge.  Patient goals were partially met. Patient is being discharged due to the patient's request.  ?????      Problem List Patient Active Problem List   Diagnosis Date Noted  . Right wrist pain 08/02/2019  . Rotator cuff arthropathy, left 08/02/2019  . Chronic migraine without aura without status migrainosus, not intractable 08/02/2019  . Menopausal symptom 08/02/2019  . Left foot pain 01/19/2018  . Genetic testing 12/16/2017  . Family history of breast cancer in sister   . Family history of bladder cancer   . Family history of leukemia   . Family history of skin cancer   . Family history of kidney cancer   . Leiomyoma of uterus, unspecified 05/08/2013  . Chondromalacia of patellofemoral joint, left 08/03/2008   Ruben Im, PT 11/09/19 5:28 PM Phone: [phone] Fax: [phone] Alvera Singh 11/09/2019, 5:27 PM  [facility] [address], [place], [place], [address] Phone: [phone]   Fax:  [phone]  Name: Ely Spragg MRN: [id number] Date of Birth: Apr 10, 1966

## 2019-11-09 NOTE — Patient Instructions (Signed)
Access Code: [email or web address]  URL: [email or web address]  Date: 11/09/2019  Prepared by: Ruben Im   Exercises Standing Hamstring Stretch with Step - 3 reps - 1 sets - 30 sec hold - 1x daily - 7x weekly Quadriceps Stretch with Chair - 3 reps - 1 sets - 30 sec hold - 1x daily - 7x weekly Standing ITB Stretch - 3 reps - 1 sets - 30 sec hold - 1x daily - 7x weekly Supine Shoulder External Rotation on Foam Roll with Theraband - 10 reps - 2 sets - 1x daily - 7x weekly Seated Shoulder External Rotation PROM on Table - 10 reps - 1 sets - 5 sec hold - 1x daily - 7x weekly Seated Shoulder Flexion Slide at Table Top with Forearm in Neutral - 10 reps - 1 sets - 5 sec hold - 1x daily - 7x weekly Supine Shoulder Circles - 10 reps - 3 sets - 1x daily - 7x weekly Standing Low Shoulder Row with Anchored Resistance - 10 reps - 1 sets - 1x daily - 7x weekly Single Arm Shoulder Extension with Anchored Resistance - 10 reps - 1 sets - 1x daily - 7x weekly Prone Single Arm Shoulder Extension on Swiss Ball with Dumbbell - 10 reps - 1 sets - 1x daily - 7x weekly Prone Single Arm Shoulder Horizontal Abduction with Dumbbell - Palm Down - 10 reps - 1 sets - 1x daily - 7x weekly Prone Single Arm Shoulder Y - 10 reps - 1 sets - 1x daily - 7x weekly

## 2019-11-11 ENCOUNTER — Ambulatory Visit: Payer: No Typology Code available for payment source | Admitting: Physical Therapy

## 2019-11-21 IMAGING — DX DG KNEE COMPLETE 4+V*L*
4 series · 4 of 4 positions shown · non-contrast
Comparison: None.

CLINICAL DATA: Posterior knee pain for 2 months, no known injury,
initial encounter

EXAM:
LEFT KNEE - COMPLETE 4+ VIEW

[knee ap]
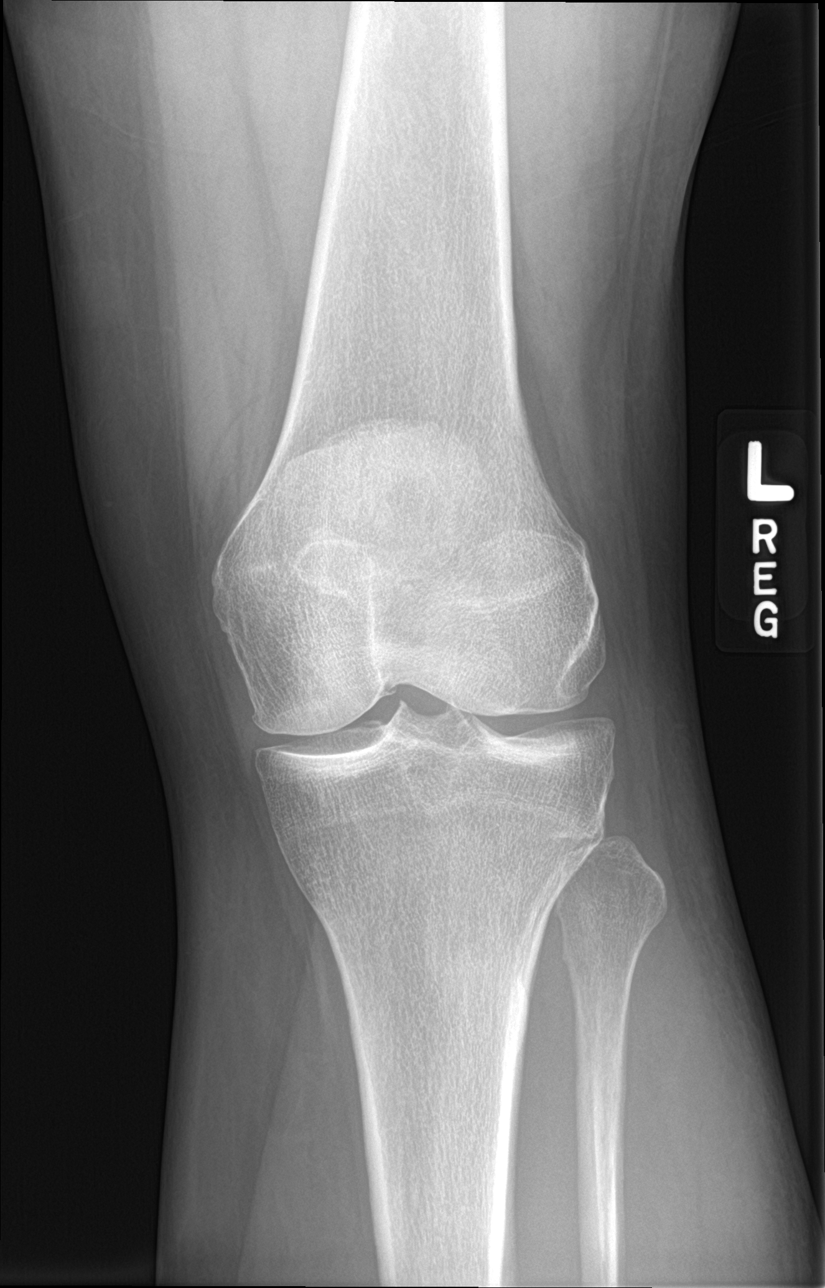

[tunnel]
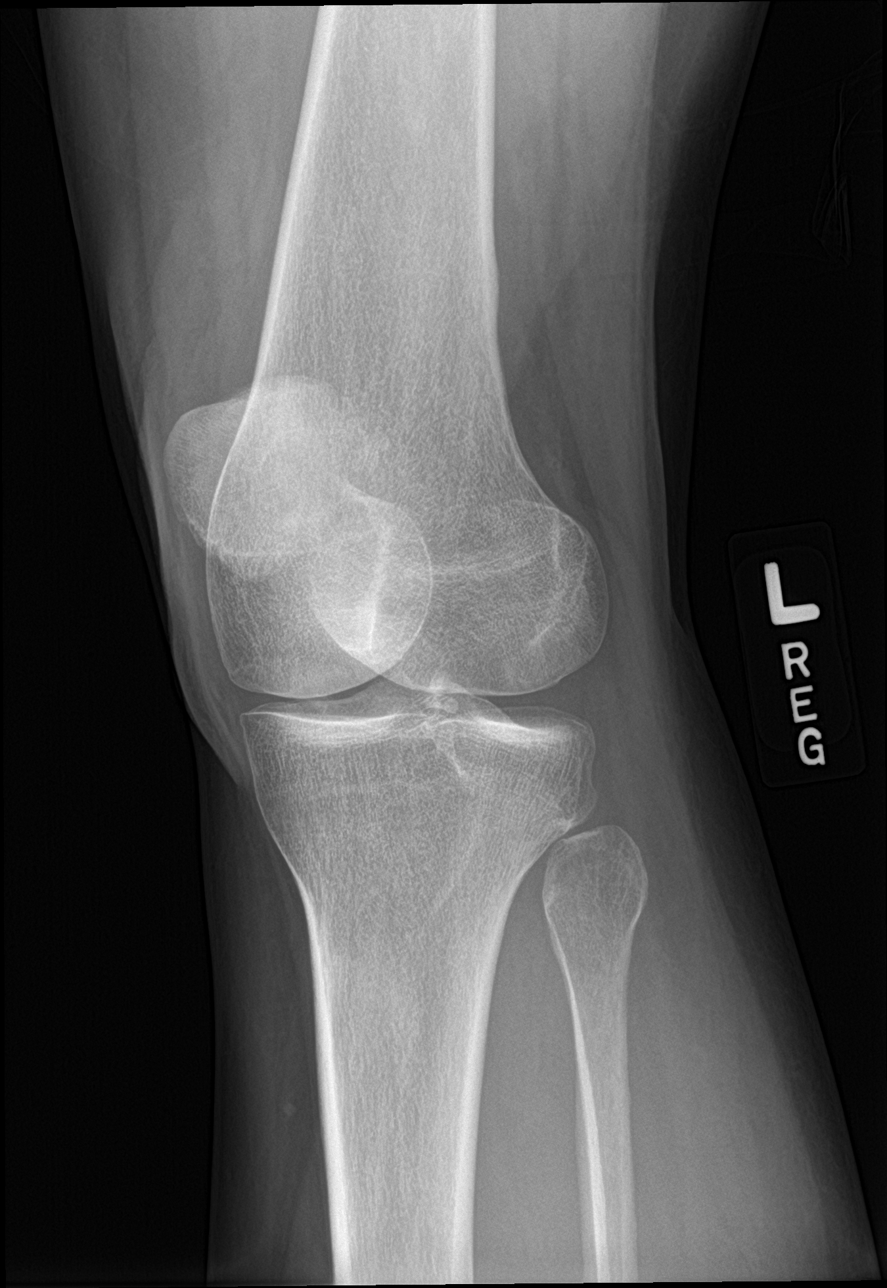

[knee lat]
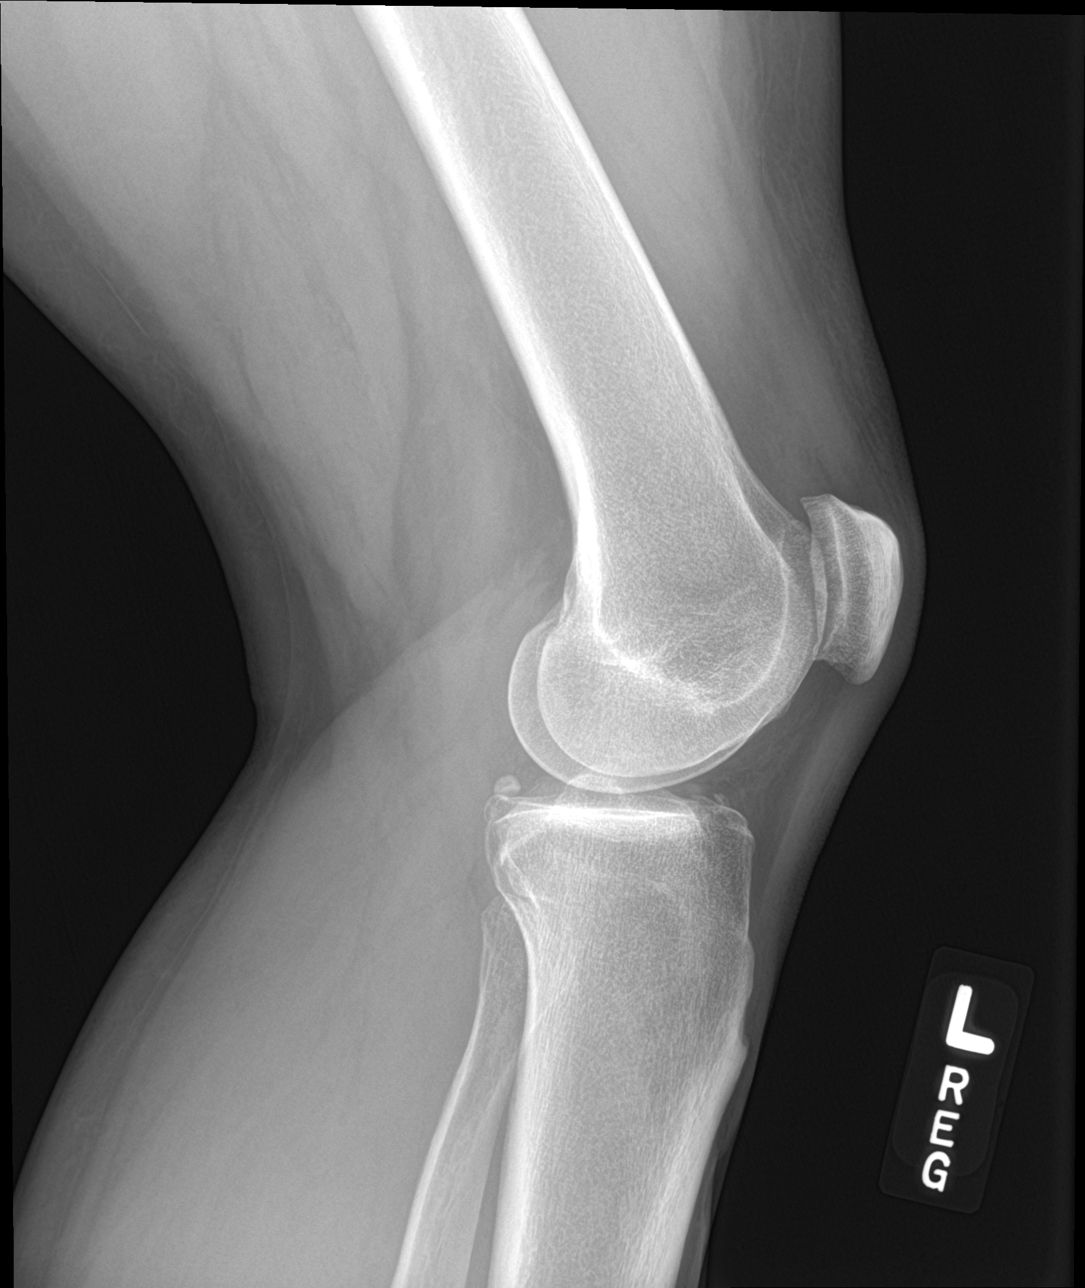

[knee obl]
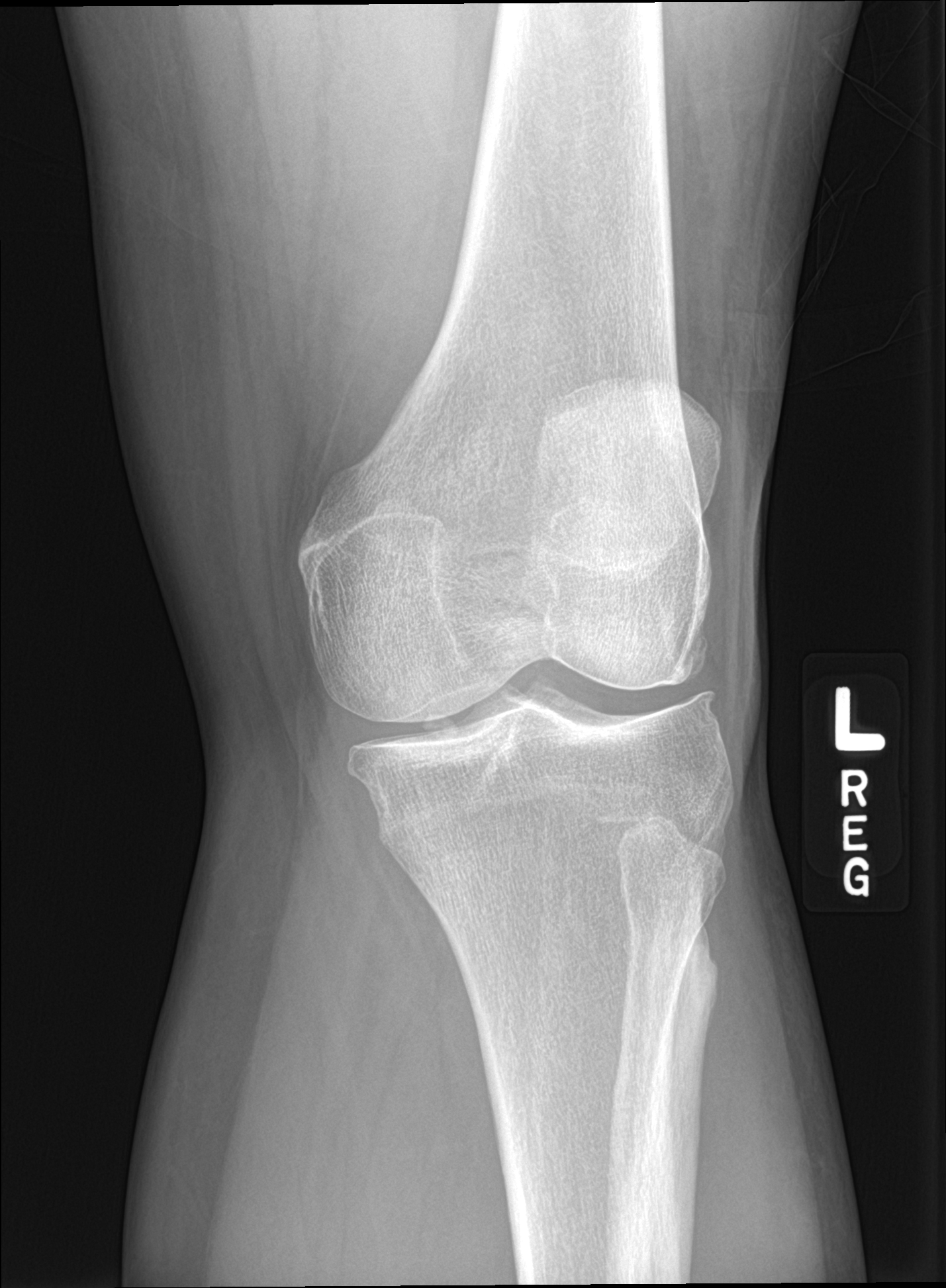

[4 of 4 positions shown; findings below may reference images not displayed]

FINDINGS: There are mild degenerative changes are noted in the lateral joint
space and patellofemoral space. No acute fracture or dislocation is
noted. No soft tissue abnormality is seen.
IMPRESSION: Mild degenerative change without acute abnormality.

## 2019-12-27 IMAGING — MG MM DIGITAL DIAGNOSTIC UNILAT*L* W/ TOMO W/ CAD
4 series · 4 of 12 positions shown · non-contrast
Comparison: Previous exam(s).

CLINICAL DATA: Possible mass in the outer retroareolar left breast
in the craniocaudal projection of a recent screening mammogram.

EXAM:
DIGITAL DIAGNOSTIC UNILATERAL LEFT MAMMOGRAM WITH CAD AND TOMO

[L CC synth-2D]
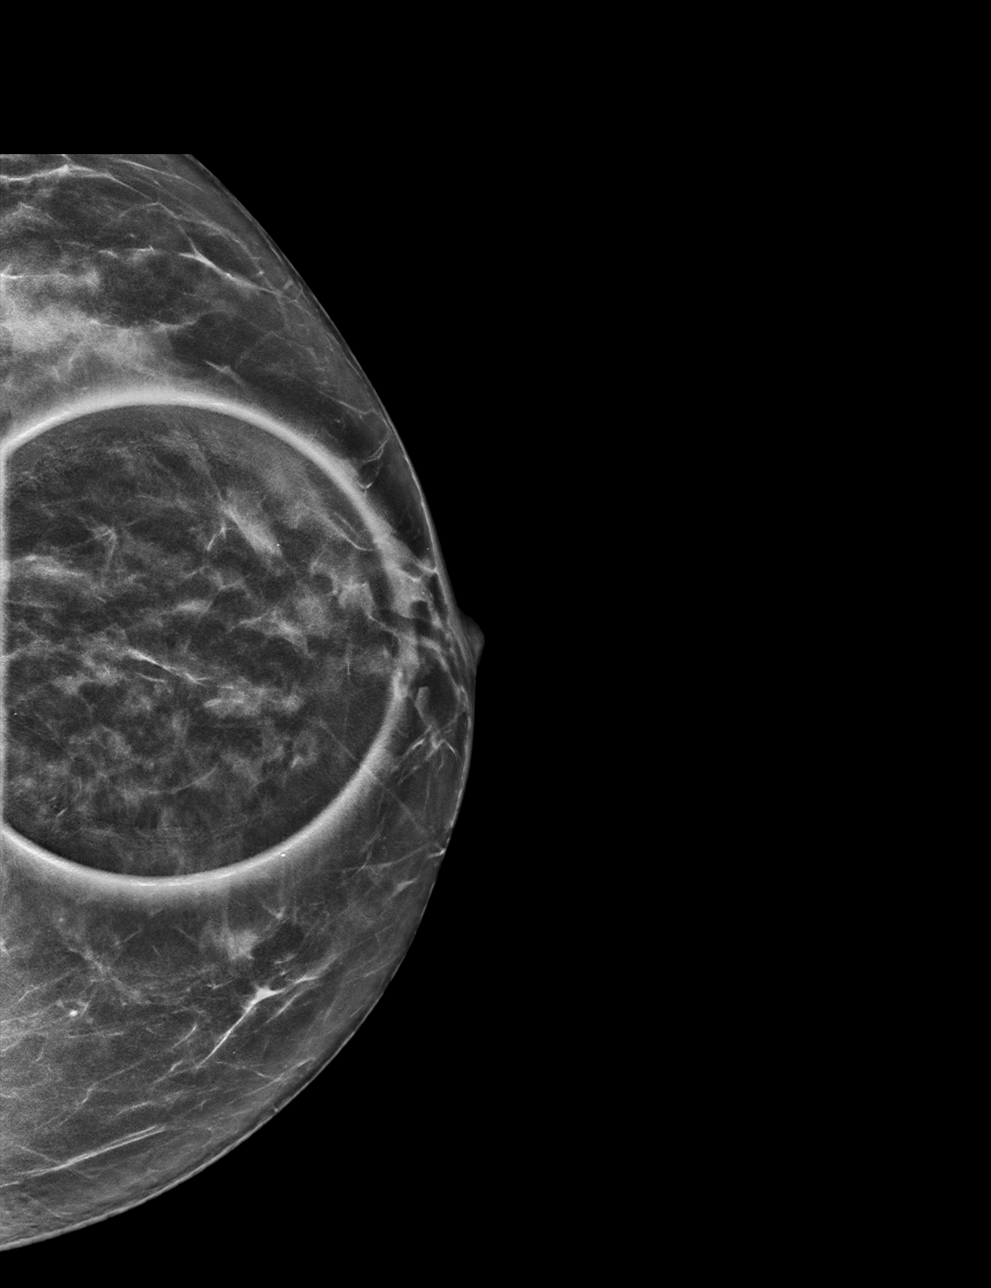

[L ML synth-2D]
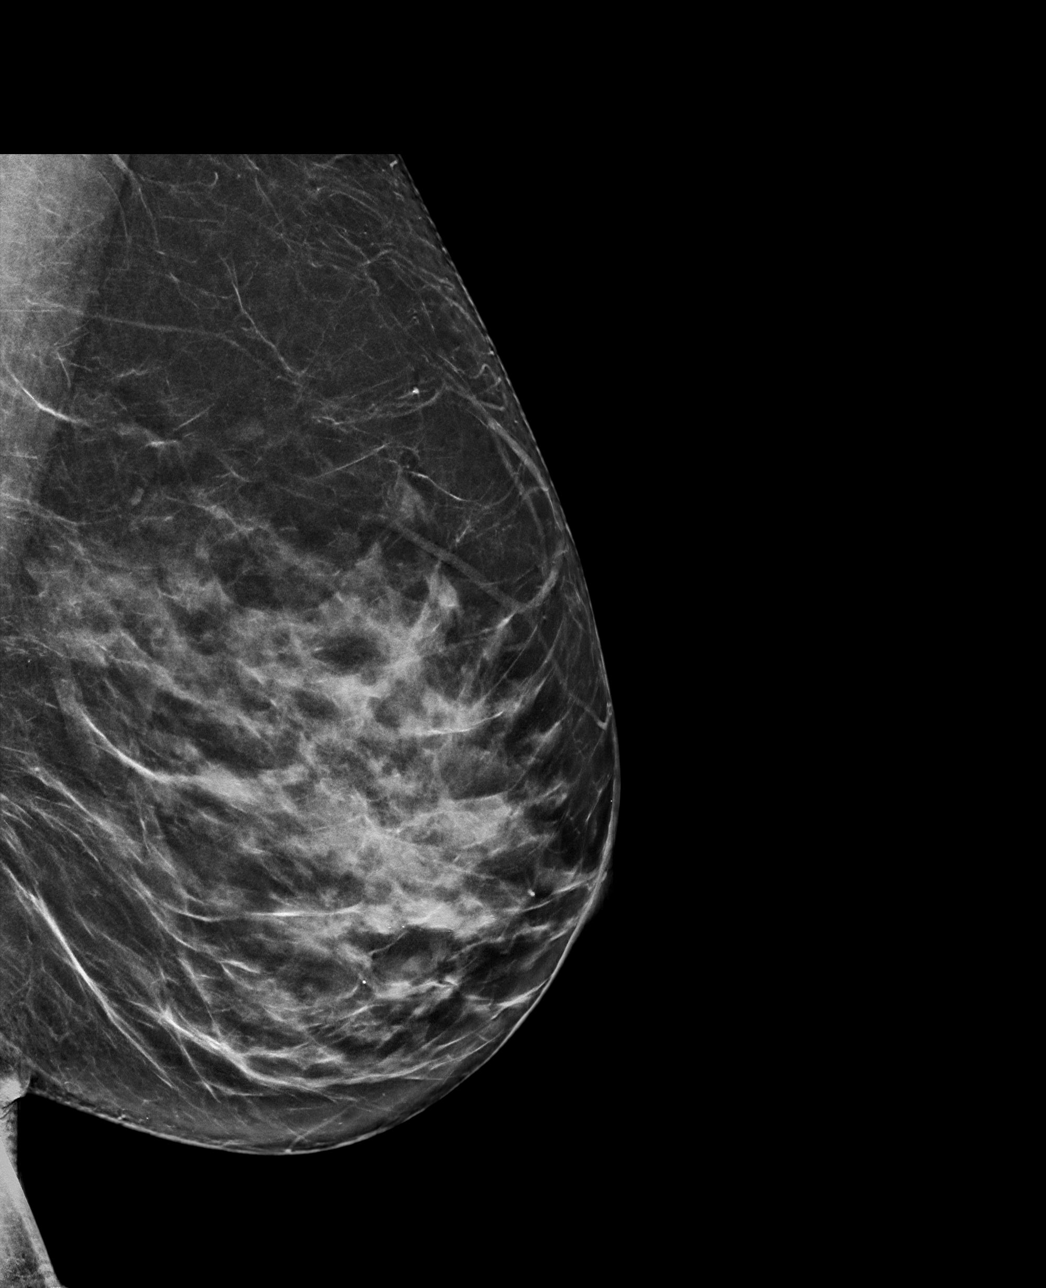

[L ML tomo · tomo slice 38/75.0]
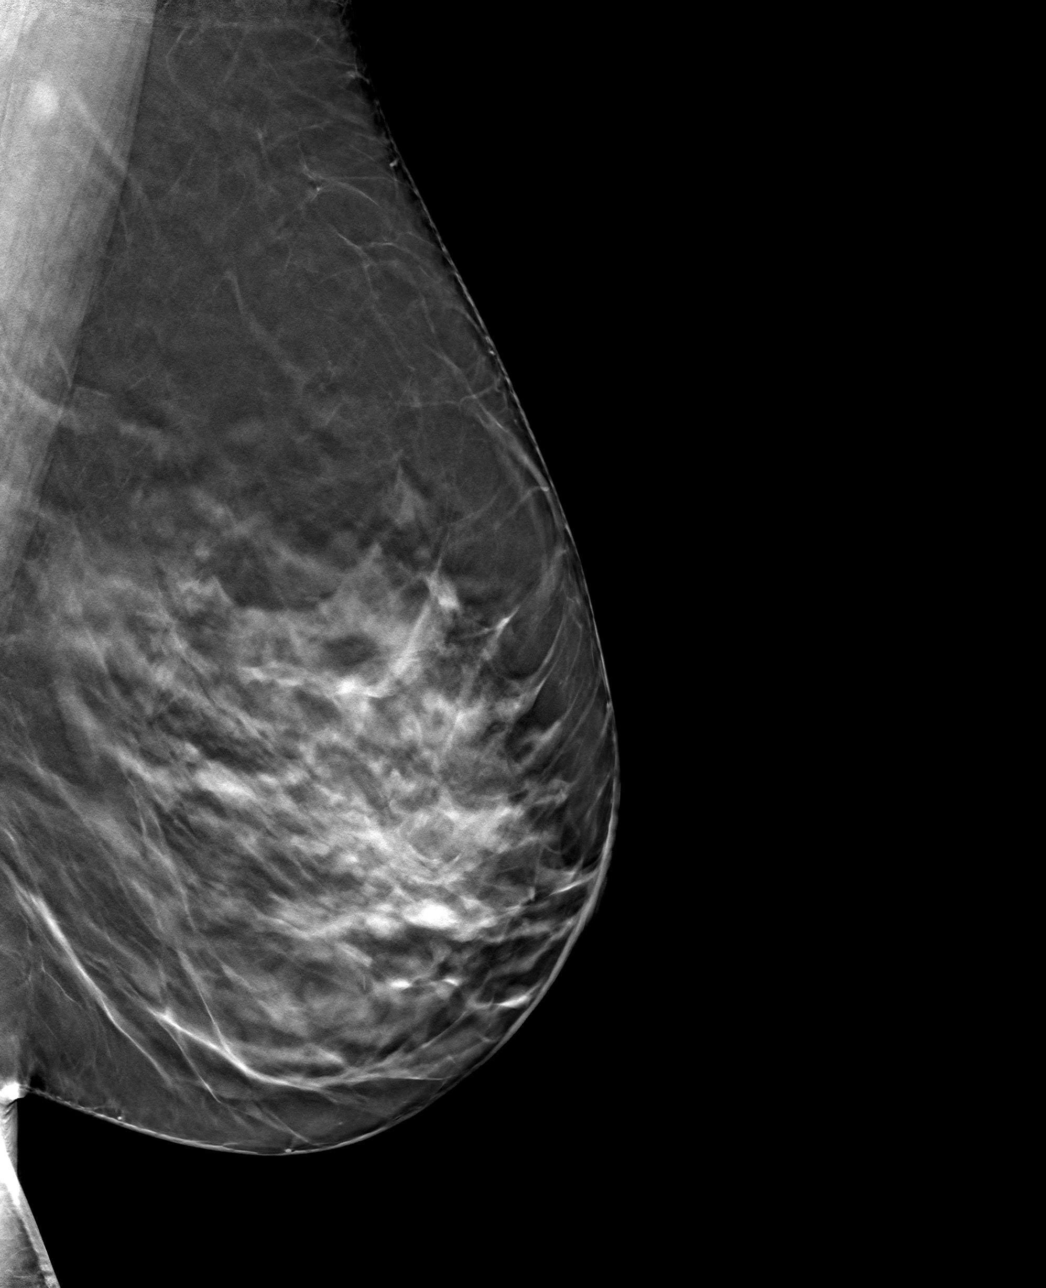

[L CC tomo · tomo slice 29/58.0]
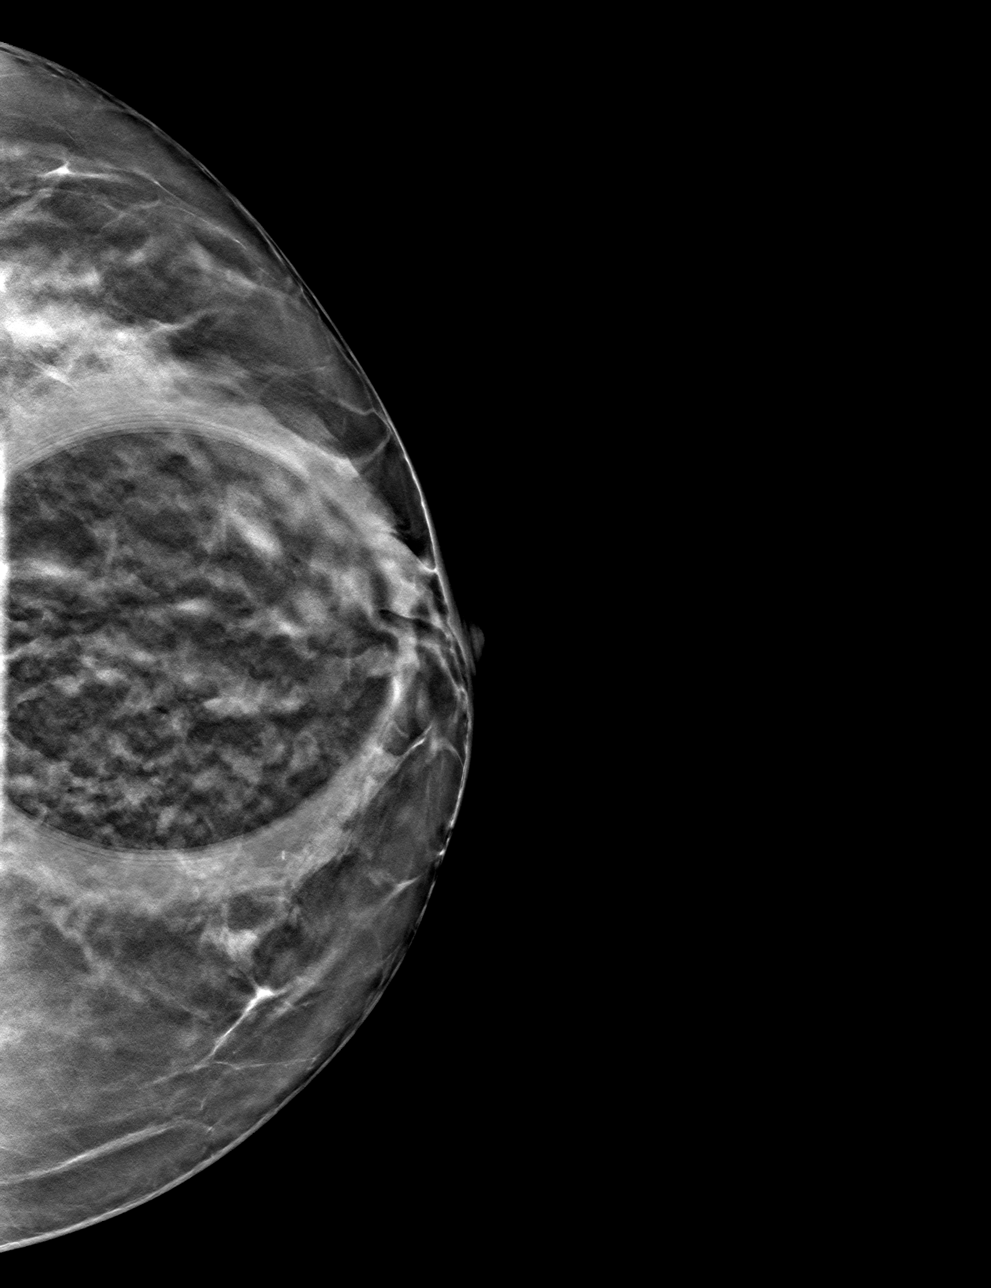

[4 of 12 positions shown; findings below may reference images not displayed]

ACR Breast Density Category c: The breast tissue is heterogeneously
dense, which may obscure small masses.
FINDINGS: 3D tomographic and 2D generated true lateral and spot compression
craniocaudal views of the left breast demonstrate normal appearing
fibroglandular tissue at the location of the recently suspected
mass, unchanged compared previous examinations.

Mammographic images were processed with CAD.
IMPRESSION: No evidence of malignancy. The recently suspected left breast mass
was close apposition of normal breast tissue.

RECOMMENDATION:
Bilateral screening mammogram in 1 year.

I have discussed the findings and recommendations with the patient.
If applicable, a reminder letter will be sent to the patient
regarding the next appointment.

BI-RADS CATEGORY  1: Negative.

## 2020-04-05 ENCOUNTER — Other Ambulatory Visit: Payer: Self-pay

## 2020-04-05 ENCOUNTER — Encounter: Payer: Self-pay | Admitting: Osteopathic Medicine

## 2020-04-05 ENCOUNTER — Telehealth: Payer: Self-pay | Admitting: Osteopathic Medicine

## 2020-04-05 ENCOUNTER — Ambulatory Visit (INDEPENDENT_AMBULATORY_CARE_PROVIDER_SITE_OTHER): Payer: BC Managed Care – PPO | Admitting: Osteopathic Medicine

## 2020-04-05 VITALS — BP 111/60 | HR 59 | Temp 97.9°F | Wt 170.1 lb

## 2020-04-05 DIAGNOSIS — N906 Unspecified hypertrophy of vulva: Secondary | ICD-10-CM | POA: Diagnosis not present

## 2020-04-05 DIAGNOSIS — Z0000 Encounter for general adult medical examination without abnormal findings: Secondary | ICD-10-CM

## 2020-04-05 DIAGNOSIS — R21 Rash and other nonspecific skin eruption: Secondary | ICD-10-CM

## 2020-04-05 MED ORDER — CLOTRIMAZOLE 1 % EX CREA: TOPICAL_CREAM | Freq: Two times a day (BID) | CUTANEOUS | 1 refills | Status: DC

## 2020-04-05 MED ORDER — FLUCONAZOLE 150 MG PO TABS
150.0000 mg | ORAL_TABLET | ORAL | 0 refills | Status: DC
Start: 2020-04-05 — End: 2020-05-01

## 2020-04-05 MED FILL — FLUCONAZOLE 150 MG TABS: 150 | 28 days supply | Qty: 4 | Fill #0

## 2020-04-05 NOTE — Progress Notes (Signed)
Denise Smith is a 54 y.o. female who presents to  [facility]  today, 04/05/20, seeking care for the following: . Skin problems      ASSESSMENT & PLAN with other pertinent history/findings:  The primary encounter diagnosis was Painful enlargement of labia minora. Diagnoses of Rash and nonspecific skin eruption, Ddx tinea vs pityriasis rosea  and Annual physical exam were also pertinent to this visit.  1. Painful enlargement of labia minora No skin tag I can tell, which is what a dermatologist noted before, there is some enlargement of the labia but no skin breakdown, reports painful to wide, ride a bike, etc.   2. Rash and nonspecific skin eruption, Ddx tinea vs pityriasis rosea  Patchy red rash, ?herald patch per history, ongoing nearly 2 mos at this point. See ptinstructions   3. Annual physical exam Labs ordered for future visit. Annual physical / preventive care was NOT performed or billed today.     Patient Instructions  Plan:  Rash looks like a fungal infection vs a condition called pityriasis rosea. Fungal infections are treatable, let's try treatment! I sent cream to use twice daily for 2-3 weeks, and weekly pill for 4 weeks. If not better, we can assume it's pityriasis rosea which unfortunately we just have to wait for it to run its course, though exposure to mild/moderate UV light (sunbathing!) can help   Vaginal area does not look dangerous but let's get you to OBGYN to discuss removal.       Orders Placed This Encounter  Procedures  . CBC  . COMPLETE METABOLIC PANEL WITH GFR  . Lipid panel  . Ambulatory referral to Obstetrics / Gynecology    Meds ordered this encounter  Medications  . clotrimazole (LOTRIMIN) 1 % cream    Sig: Apply topically 2 (two) times daily.    Dispense:  60 g    Refill:  1  . fluconazole (DIFLUCAN) 150 MG tablet    Sig: Take 1 tablet (150 mg total) by mouth once a week  for 28 doses.    Dispense:  4 tablet    Refill:  0       Follow-up instructions: Return in about 2 months (around 06/05/2020) for ANNUAL (get labs prior to visit, orders are in).                                         BP 111/60 (BP Location: Left Arm, Patient Position: Sitting, Cuff Size: Normal)   Pulse (!) 59   Temp 97.9 F (36.6 C) (Oral)   Wt 170 lb 1.9 oz (77.2 kg)   LMP 04/21/2013   BMI 26.64 kg/m   Current Meds  Medication Sig  . diclofenac sodium (VOLTAREN) 1 % GEL   . meloxicam (MOBIC) 15 MG tablet One tab PO qAM with breakfast for 2 weeks, then daily prn pain.  . SUMAtriptan (IMITREX) 25 MG tablet Take 1 tablet (25 mg total) by mouth every 2 (two) hours as needed for migraine.    No results found for this or any previous visit (from the past 72 hour(s)).  No results found.  Depression screen [facility] 2/9 07/29/2019  Decreased Interest 0  Down, Depressed, Hopeless 0  PHQ - 2 Score 0  Altered sleeping 0  Tired, decreased energy 0  Change in appetite 0  Feeling bad  or failure about yourself  0  Trouble concentrating 0  Moving slowly or fidgety/restless 0  Suicidal thoughts 0  PHQ-9 Score 0  Difficult doing work/chores Not difficult at all    GAD 7 : Generalized Anxiety Score 07/29/2019  Nervous, Anxious, on Edge 0  Control/stop worrying 0  Worry too much - different things 0  Trouble relaxing 0  Restless 0  Easily annoyed or irritable 0  Afraid - awful might happen 0  Total GAD 7 Score 0  Anxiety Difficulty Not difficult at all      All questions at time of visit were answered - patient instructed to contact office with any additional concerns or updates.  ER/RTC precautions were reviewed with the patient.  Please note: voice recognition software was used to produce this document, and typos may escape review. Please contact Dr. Sheppard Coil for any needed clarifications.

## 2020-04-05 NOTE — Telephone Encounter (Signed)
Task completed. Pt's pharmacy information has been updated.

## 2020-04-05 NOTE — Patient Instructions (Signed)
Plan:  Rash looks like a fungal infection vs a condition called pityriasis rosea. Fungal infections are treatable, let's try treatment! I sent cream to use twice daily for 2-3 weeks, and weekly pill for 4 weeks. If not better, we can assume it's pityriasis rosea which unfortunately we just have to wait for it to run its course, though exposure to mild/moderate UV light (sunbathing!) can help   Vaginal area does not look dangerous but let's get you to OBGYN to discuss removal.

## 2020-04-05 NOTE — Telephone Encounter (Signed)
Pt forgot to mention that her new pharmacy is [facility] in [place].  Thanks

## 2020-05-01 ENCOUNTER — Other Ambulatory Visit: Payer: Self-pay

## 2020-05-01 ENCOUNTER — Encounter: Payer: Self-pay | Admitting: Obstetrics and Gynecology

## 2020-05-01 ENCOUNTER — Ambulatory Visit (INDEPENDENT_AMBULATORY_CARE_PROVIDER_SITE_OTHER): Payer: BC Managed Care – PPO | Admitting: Obstetrics and Gynecology

## 2020-05-01 VITALS — BP 104/67 | HR 73 | Resp 16 | Ht 67.0 in | Wt 168.0 lb

## 2020-05-01 DIAGNOSIS — L918 Other hypertrophic disorders of the skin: Secondary | ICD-10-CM

## 2020-05-01 DIAGNOSIS — R102 Pelvic and perineal pain unspecified side: Secondary | ICD-10-CM

## 2020-05-01 DIAGNOSIS — N9089 Other specified noninflammatory disorders of vulva and perineum: Secondary | ICD-10-CM | POA: Diagnosis not present

## 2020-05-01 NOTE — Patient Instructions (Addendum)
ICD 10 codes Skin tag of perineum N90.89 Painful skin lesion L98.9 Multiple acquired skin tags L91.8 Vulvar pain R10.2

## 2020-05-01 NOTE — Progress Notes (Signed)
GYNECOLOGY OFFICE VISIT NOTE  History:  54 y.o. G0P0000 here today for skin tags. Reports she has had them for about a year and they have been bothering her for about 6 months. The lower one is quite tender to touch, she has pain with bike riding, intercourse, sometimes has difficulty cleaning herself well due to pain. Upper one is less tender but still bothersome. She denies any abnormal vaginal discharge, bleeding, pelvic pain or other concerns.   Past Medical History:  Diagnosis Date  . Anemia   . Family history of bladder cancer   . Family history of breast cancer   . Family history of kidney cancer   . Family history of leukemia   . Family history of skin cancer   . GNFAOZHY(865.7)     Past Surgical History:  Procedure Laterality Date  . ABDOMINAL HYSTERECTOMY N/A 05/06/2013   Procedure: TOTAL ABDOMINAL HYSTERECTOMY;  Surgeon: Margarette Asal, MD;  Location: [facility];  Service: Gynecology;  Laterality: N/A;  . COLON RESECTION  1997  . LYSIS OF ADHESION N/A 05/06/2013   Procedure: LYSIS OF ADHESION;  Surgeon: Margarette Asal, MD;  Location: [facility];  Service: Gynecology;  Laterality: N/A;  . MOUTH SURGERY       Current Outpatient Medications:  .  acetaminophen (TYLENOL) 325 MG tablet, Take 2 tablets (650 mg total) by mouth every 6 (six) hours as needed for mild pain or moderate pain., Disp: , Rfl:  .  clotrimazole (LOTRIMIN) 1 % cream, Apply topically 2 (two) times daily., Disp: 60 g, Rfl: 1 .  diclofenac sodium (VOLTAREN) 1 % GEL, , Disp: , Rfl:  .  meloxicam (MOBIC) 15 MG tablet, One tab PO qAM with breakfast for 2 weeks, then daily prn pain., Disp: 30 tablet, Rfl: 3 .  SUMAtriptan (IMITREX) 25 MG tablet, Take 1 tablet (25 mg total) by mouth every 2 (two) hours as needed for migraine., Disp: 10 tablet, Rfl: 3 .  topiramate (TOPAMAX) 50 MG tablet, Take 1 tablet (50 mg total) by mouth daily. (Patient not taking: Reported on 04/05/2020), Disp: 90 tablet, Rfl: 1 .  VITAMIN E,  TOPICAL, OIL, Apply 1 application topically daily. (Patient not taking: Reported on 04/05/2020), Disp: , Rfl:   The following portions of the patient's history were reviewed and updated as appropriate: allergies, current medications, past family history, past medical history, past social history, past surgical history and problem list.   Health Maintenance:  Last pap: s/p hysterectomy Last mammogram: 08/2019  Review of Systems:  Pertinent items noted in HPI and remainder of comprehensive ROS otherwise negative.   Objective:  Physical Exam BP 104/67   Pulse 73   Resp 16   Ht 5\' 7"  (1.702 m)   Wt 168 lb (76.2 kg)   LMP 04/21/2013   BMI 26.31 kg/m  CONSTITUTIONAL: Well-developed, well-nourished female in no acute distress.  HENT:  Normocephalic, atraumatic. External right and left ear normal. Oropharynx is clear and moist EYES: Conjunctivae and EOM are normal. Pupils are equal, round, and reactive to light. No scleral icterus.  NECK: Normal range of motion, supple, no masses SKIN: Skin is warm and dry. No rash noted. Not diaphoretic. No erythema. No pallor. NEUROLOGIC: Alert and oriented to person, place, and time. Normal reflexes, muscle tone coordination. No cranial nerve deficit noted. PSYCHIATRIC: Normal mood and affect. Normal behavior. Normal judgment and thought content. CARDIOVASCULAR: Normal heart rate noted RESPIRATORY: Effort normal, no problems with respiration noted ABDOMEN: Soft, no distention noted.  PELVIC: Normal appearing external genitalia; 1 cm pedunculated skin tag at right perieum, tender to touch, 0.5 cm pedunculated skin tag at right labia minora MUSCULOSKELETAL: Normal range of motion. No edema noted.  Exam done with chaperone present.  Labs and Imaging No results found.  Assessment & Plan:   1. Vulvar pain Recommend removal in office via lidocaine and scalpel, pt unsure if insurance will cover, she will call insurance to see if covered and return for  removal  2. Skin tag of female perineum  3. Multiple acquired skin tags   Routine preventative health maintenance measures emphasized. Please refer to After Visit Summary for other counseling recommendations.   Return in about 4 weeks (around 05/29/2020) for Followup.   Total face-to-face time with patient: 16 minutes. Over 50% of encounter was spent on counseling and coordination of care.   Feliz Beam, M.D. Attending Center for [employer])

## 2020-05-03 ENCOUNTER — Encounter: Payer: Self-pay | Admitting: Osteopathic Medicine

## 2020-05-18 MED FILL — MELOXICAM 15 MG TABLET: 15 | 30 days supply | Qty: 30 | Fill #1

## 2020-05-23 ENCOUNTER — Encounter: Payer: Self-pay | Admitting: Osteopathic Medicine

## 2020-05-23 DIAGNOSIS — R21 Rash and other nonspecific skin eruption: Secondary | ICD-10-CM

## 2020-05-23 NOTE — Telephone Encounter (Signed)
Pt has agreed to be seen by a Dermatologist. Referral pended for provider's review.

## 2020-06-02 ENCOUNTER — Encounter: Payer: Self-pay | Admitting: Osteopathic Medicine

## 2020-06-02 NOTE — Telephone Encounter (Signed)
Assume she's talking about Derm? I understand most derm offices have a long wait  Jenny Reichmann if you know anything that might be faster, otherwise please call patient and tell her she will need to look into this herself and let us knwo where she wants to go

## 2020-06-02 NOTE — Telephone Encounter (Signed)
I sent to [facility] we have confirmed that they received the referral and will be reaching out to the patient for scheduling. - CF

## 2020-06-05 DIAGNOSIS — R21 Rash and other nonspecific skin eruption: Secondary | ICD-10-CM | POA: Diagnosis not present

## 2020-06-10 LAB — CBC
HCT: 36.9 % (ref 35.0–45.0)
Hemoglobin: 12 g/dL (ref 11.7–15.5)
MCH: 29.3 pg (ref 27.0–33.0)
MCHC: 32.5 g/dL (ref 32.0–36.0)
MCV: 90.2 fL (ref 80.0–100.0)
MPV: 10 fL (ref 7.5–12.5)
Platelets: 337 10*3/uL (ref 140–400)
RBC: 4.09 10*6/uL (ref 3.80–5.10)
RDW: 12.4 % (ref 11.0–15.0)
WBC: 6.6 10*3/uL (ref 3.8–10.8)

## 2020-06-10 LAB — COMPLETE METABOLIC PANEL WITH GFR
AG Ratio: 2.4 (calc) (ref 1.0–2.5)
ALT: 24 U/L (ref 6–29)
AST: 23 U/L (ref 10–35)
Albumin: 4.4 g/dL (ref 3.6–5.1)
Alkaline phosphatase (APISO): 57 U/L (ref 37–153)
BUN: 10 mg/dL (ref 7–25)
CO2: 31 mmol/L (ref 20–32)
Calcium: 9.7 mg/dL (ref 8.6–10.4)
Chloride: 105 mmol/L (ref 98–110)
Creat: 0.7 mg/dL (ref 0.50–1.05)
GFR, Est African American: 115 mL/min/{1.73_m2} (ref >=60)
GFR, Est Non African American: 99 mL/min/{1.73_m2} (ref >=60)
Globulin: 1.8 g/dL (calc) — ABNORMAL LOW (ref 1.9–3.7)
Glucose, Bld: 88 mg/dL (ref 65–99)
Potassium: 4.6 mmol/L (ref 3.5–5.3)
Sodium: 142 mmol/L (ref 135–146)
Total Bilirubin: 0.3 mg/dL (ref 0.2–1.2)
Total Protein: 6.2 g/dL (ref 6.1–8.1)

## 2020-06-10 LAB — LIPID PANEL
Cholesterol: 190 mg/dL (ref <200)
HDL: 59 mg/dL (ref >=50)
LDL Cholesterol (Calc): 111 mg/dL (calc) — ABNORMAL HIGH
Non-HDL Cholesterol (Calc): 131 mg/dL (calc) — ABNORMAL HIGH (ref <130)
Total CHOL/HDL Ratio: 3.2 (calc) (ref <5.0)
Triglycerides: 97 mg/dL (ref <150)

## 2020-06-12 ENCOUNTER — Other Ambulatory Visit: Payer: Self-pay

## 2020-06-12 ENCOUNTER — Encounter: Payer: Self-pay | Admitting: Obstetrics and Gynecology

## 2020-06-12 ENCOUNTER — Encounter: Payer: Self-pay | Admitting: Osteopathic Medicine

## 2020-06-12 ENCOUNTER — Ambulatory Visit (INDEPENDENT_AMBULATORY_CARE_PROVIDER_SITE_OTHER): Payer: BC Managed Care – PPO | Admitting: Osteopathic Medicine

## 2020-06-12 ENCOUNTER — Other Ambulatory Visit: Payer: Self-pay | Admitting: Obstetrics and Gynecology

## 2020-06-12 ENCOUNTER — Ambulatory Visit (INDEPENDENT_AMBULATORY_CARE_PROVIDER_SITE_OTHER): Payer: BC Managed Care – PPO | Admitting: Obstetrics and Gynecology

## 2020-06-12 VITALS — BP 130/70 | HR 63 | Temp 98.0°F | Wt 172.0 lb

## 2020-06-12 VITALS — BP 130/70 | HR 63 | Wt 172.0 lb

## 2020-06-12 DIAGNOSIS — Z23 Encounter for immunization: Secondary | ICD-10-CM | POA: Diagnosis not present

## 2020-06-12 DIAGNOSIS — Z0000 Encounter for general adult medical examination without abnormal findings: Secondary | ICD-10-CM | POA: Diagnosis not present

## 2020-06-12 DIAGNOSIS — G43709 Chronic migraine without aura, not intractable, without status migrainosus: Secondary | ICD-10-CM

## 2020-06-12 DIAGNOSIS — N9089 Other specified noninflammatory disorders of vulva and perineum: Secondary | ICD-10-CM

## 2020-06-12 DIAGNOSIS — L918 Other hypertrophic disorders of the skin: Secondary | ICD-10-CM | POA: Diagnosis not present

## 2020-06-12 DIAGNOSIS — M2242 Chondromalacia patellae, left knee: Secondary | ICD-10-CM

## 2020-06-12 DIAGNOSIS — Z1231 Encounter for screening mammogram for malignant neoplasm of breast: Secondary | ICD-10-CM

## 2020-06-12 DIAGNOSIS — F988 Other specified behavioral and emotional disorders with onset usually occurring in childhood and adolescence: Secondary | ICD-10-CM | POA: Insufficient documentation

## 2020-06-12 MED ORDER — ATOMOXETINE HCL 40 MG PO CAPS: 40.0000 mg | ORAL_CAPSULE | Freq: Every day | ORAL | 1 refills | Status: DC

## 2020-06-12 MED ORDER — MELOXICAM 15 MG PO TABS: 15.0000 mg | ORAL_TABLET | Freq: Every day | ORAL | 3 refills | Status: DC | PRN

## 2020-06-12 NOTE — Addendum Note (Signed)
Addended by: Asencion Islam on: 06/12/2020 03:21 PM   Modules accepted: Orders

## 2020-06-12 NOTE — Progress Notes (Signed)
Denise Smith is a 54 y.o. female who presents to  [place] at [facility]  today, 06/12/20, seeking care for the following:  . Annual physical, labs reviewed . Concerned w/ some increased concentration problems, previously on adderall but would like to avoid getting back on stimulants.    ASSESSMENT & PLAN with other pertinent findings:  The primary encounter diagnosis was Annual physical exam. Diagnoses of Chondromalacia of patellofemoral joint, left, Chronic migraine without aura without status migrainosus, not intractable, Encounter for screening mammogram for malignant neoplasm of breast, and Attention deficit disorder, unspecified hyperactivity presence were also pertinent to this visit.      Patient Instructions  General Preventive Care  Most recent routine screening labs: already done - see attached.   Blood pressure goal 130/80 or less.   Tobacco: don't!   Alcohol: responsible moderation is ok for most adults - if you have concerns about your alcohol intake, please talk to me!   Exercise: as tolerated to reduce risk of cardiovascular disease and diabetes. Strength training will also prevent osteoporosis.   Mental health: if need for mental health care (medicines, counseling, other), or concerns about moods, please let me know!   Sexual / Reproductive health: if need for STD testing, or if concerns with libido/pain problems, please let me know!  Advanced Directive: Living Will and/or Healthcare Power of Attorney recommended for all adults, regardless of age or health.  Vaccines  Flu vaccine: for almost everyone, every fall.   Shingles vaccine: after age 10.   Pneumonia vaccines: after age 78.  Tetanus booster: every 10 years   COVID vaccine: THANKS for getting your vaccine! :) Plan for booster at 8 mos after second shot  Cancer screenings   Colon cancer screening: for everyone age 22-75. Colonoscopy reported  done 2019  Breast cancer screening: mammogram annually age 54-75, due 08/2020.   Cervical cancer screening: Pap not needed w/ hysterectomy.   Lung cancer screening: CT chest every year for those aged 11 to 48 years who have a 20 pack-year smoking history and currently smoke or have quit within the past 15 years   Infection screenings  . HIV: recommended screening at least once age 52-65, more often as needed. . Gonorrhea/Chlamydia: screening as needed . Hepatitis C: recommended once for everyone age 39-75 . TB: certain at-risk populations, or depending on work requirements and/or travel history Other . Bone Density Test: recommended for women at age 74   Orders Placed This Encounter  Procedures  . MM 3D SCREEN BREAST BILATERAL    Meds ordered this encounter  Medications  . meloxicam (MOBIC) 15 MG tablet    Sig: Take 1 tablet (15 mg total) by mouth daily as needed for pain.    Dispense:  30 tablet    Refill:  3  . atomoxetine (STRATTERA) 40 MG capsule    Sig: Take 1 capsule (40 mg total) by mouth daily.    Dispense:  30 capsule    Refill:  1       Follow-up instructions: Return in about 1 year (around 06/12/2021) for ANNUAL (call week prior to visit for lab orders).Message me in a few weeks w/ how strattera is working, can arrange virtual visit if needed. Pt to schedule nurse visit for Shingrix.  BP 130/70 (BP Location: Left Arm, Patient Position: Sitting)   Pulse 63   Temp 98 F (36.7 C)   Wt 172 lb (78 kg)   LMP 04/21/2013   SpO2 98%   BMI 26.94 kg/m   No outpatient medications have been marked as taking for the 06/12/20 encounter (Office Visit) with Emeterio Reeve, DO.    No results found for this or any previous visit (from the past 72 hour(s)).  No results found.     All questions at time of visit were answered - patient instructed to contact office with any additional  concerns or updates.  ER/RTC precautions were reviewed with the patient as applicable.   Please note: voice recognition software was used to produce this document, and typos may escape review. Please contact Dr. Sheppard Coil for any needed clarifications.

## 2020-06-12 NOTE — Progress Notes (Signed)
    SKIN TAG REMOVAL PROCEDURE NOTE   Denise Smith is a 54 y.o. [id number] here for skin tag removal in right labia, upper and lower.  She was counseled regarding the risks/benefits of removal including risk of infection, bleeding, possible need for stitch, reaction to local anesthesia, pain. She verbalized understanding of all of the above and consent signed.    Skin Tag Removal  Patient identified and an adequate time out was performed. The two areas were identified and cleaned 3x with betadine. 1 mL of 1% lidocaine was injected around the area to good effect. The skin tags were removed in entirety with a scalpel and sent to pathology. Pressure & silver nitrate was used to control bleeding. Patient tolerated well.    Patient was given post-procedure instructions.  She was reminded to keep area clean and dry, that some spotting would be normal. To call with any issues.    Feliz Beam, M.D. Attending Center for [employer] farm manager)

## 2020-06-12 NOTE — Patient Instructions (Addendum)
General Preventive Care  Most recent routine screening labs: already done - see attached.   Blood pressure goal 130/80 or less.   Tobacco: don't!   Alcohol: responsible moderation is ok for most adults - if you have concerns about your alcohol intake, please talk to me!   Exercise: as tolerated to reduce risk of cardiovascular disease and diabetes. Strength training will also prevent osteoporosis.   Mental health: if need for mental health care (medicines, counseling, other), or concerns about moods, please let me know!   Sexual / Reproductive health: if need for STD testing, or if concerns with libido/pain problems, please let me know!  Advanced Directive: Living Will and/or Healthcare Power of Attorney recommended for all adults, regardless of age or health.  Vaccines  Flu vaccine: for almost everyone, every fall.   Shingles vaccine: after age 57.   Pneumonia vaccines: after age 60.  Tetanus booster: every 10 years   COVID vaccine: THANKS for getting your vaccine! :) Plan for booster at 8 mos after second shot  Cancer screenings   Colon cancer screening: for everyone age 73-75. Colonoscopy reported done 2019  Breast cancer screening: mammogram annually age 42-75, due 08/2020.   Cervical cancer screening: Pap not needed w/ hysterectomy.   Lung cancer screening: CT chest every year for those aged 84 to 77 years who have a 20 pack-year smoking history and currently smoke or have quit within the past 15 years   Infection screenings   HIV: recommended screening at least once age 26-65, more often as needed.  Gonorrhea/Chlamydia: screening as needed  Hepatitis C: recommended once for everyone age 59-45  TB: certain at-risk populations, or depending on work requirements and/or travel history Other  Bone Density Test: recommended for women at age 60

## 2020-06-28 ENCOUNTER — Other Ambulatory Visit: Payer: Self-pay

## 2020-06-28 DIAGNOSIS — L309 Dermatitis, unspecified: Secondary | ICD-10-CM | POA: Diagnosis not present

## 2020-06-28 DIAGNOSIS — D485 Neoplasm of uncertain behavior of skin: Secondary | ICD-10-CM | POA: Diagnosis not present

## 2020-06-28 DIAGNOSIS — L308 Other specified dermatitis: Secondary | ICD-10-CM | POA: Diagnosis not present

## 2020-07-31 DIAGNOSIS — R21 Rash and other nonspecific skin eruption: Secondary | ICD-10-CM | POA: Diagnosis not present

## 2020-07-31 DIAGNOSIS — J309 Allergic rhinitis, unspecified: Secondary | ICD-10-CM | POA: Diagnosis not present

## 2020-08-12 ENCOUNTER — Other Ambulatory Visit: Payer: Self-pay | Admitting: Osteopathic Medicine

## 2020-08-15 ENCOUNTER — Other Ambulatory Visit: Payer: Self-pay

## 2020-08-15 MED ORDER — ATOMOXETINE HCL 40 MG PO CAPS
40.0000 mg | ORAL_CAPSULE | Freq: Every day | ORAL | 3 refills | Status: DC
Start: 2020-08-15 — End: 2021-04-23

## 2020-08-15 NOTE — Telephone Encounter (Signed)
Pt called requesting med refill for Strattera. Rx pended.

## 2020-09-15 ENCOUNTER — Ambulatory Visit
Admission: RE | Admit: 2020-09-15 | Discharge: 2020-09-15 | Disposition: A | Discharge status: Home / Self Care | Payer: BC Managed Care – PPO | Source: Ambulatory Visit | Attending: Osteopathic Medicine | Admitting: Osteopathic Medicine

## 2020-09-15 ENCOUNTER — Other Ambulatory Visit: Payer: Self-pay

## 2020-09-15 DIAGNOSIS — Z1231 Encounter for screening mammogram for malignant neoplasm of breast: Secondary | ICD-10-CM | POA: Diagnosis not present

## 2020-10-22 DIAGNOSIS — Z1152 Encounter for screening for COVID-19: Secondary | ICD-10-CM | POA: Diagnosis not present

## 2020-11-20 DIAGNOSIS — H25011 Cortical age-related cataract, right eye: Secondary | ICD-10-CM | POA: Diagnosis not present

## 2020-11-20 DIAGNOSIS — H40013 Open angle with borderline findings, low risk, bilateral: Secondary | ICD-10-CM | POA: Diagnosis not present

## 2021-04-17 ENCOUNTER — Encounter: Payer: Self-pay | Admitting: Osteopathic Medicine

## 2021-04-23 ENCOUNTER — Other Ambulatory Visit: Payer: Self-pay | Admitting: Osteopathic Medicine

## 2021-04-23 ENCOUNTER — Ambulatory Visit (INDEPENDENT_AMBULATORY_CARE_PROVIDER_SITE_OTHER): Payer: No Typology Code available for payment source | Admitting: Osteopathic Medicine

## 2021-04-23 ENCOUNTER — Other Ambulatory Visit: Payer: Self-pay

## 2021-04-23 ENCOUNTER — Encounter: Payer: Self-pay | Admitting: Osteopathic Medicine

## 2021-04-23 VITALS — BP 126/70 | HR 71 | Ht 67.0 in | Wt 177.0 lb

## 2021-04-23 DIAGNOSIS — N952 Postmenopausal atrophic vaginitis: Secondary | ICD-10-CM | POA: Diagnosis not present

## 2021-04-23 DIAGNOSIS — R5382 Chronic fatigue, unspecified: Secondary | ICD-10-CM

## 2021-04-23 DIAGNOSIS — R339 Retention of urine, unspecified: Secondary | ICD-10-CM | POA: Diagnosis not present

## 2021-04-23 DIAGNOSIS — Z780 Asymptomatic menopausal state: Secondary | ICD-10-CM

## 2021-04-23 DIAGNOSIS — H9319 Tinnitus, unspecified ear: Secondary | ICD-10-CM

## 2021-04-23 DIAGNOSIS — M2242 Chondromalacia patellae, left knee: Secondary | ICD-10-CM

## 2021-04-23 MED ORDER — PREMARIN 0.625 MG/GM VA CREA: TOPICAL_CREAM | VAGINAL | 12 refills | Status: DC

## 2021-04-23 MED ORDER — MELOXICAM 15 MG PO TABS: 15.0000 mg | ORAL_TABLET | Freq: Every day | ORAL | 3 refills | Status: DC | PRN

## 2021-04-23 NOTE — Patient Instructions (Addendum)
Shoulder/neck: refilled Mobic, let me know if you'd like to see Dr T / need PT referral.   Tinnitus: no cure for this condition.   Fatigue/Menopause, Urinary problems: Labs today. Menopause may be contributing to this. We can try treating with vaginal estrogen, and/or can follow w/ OBGYN if this isn't helping .

## 2021-04-23 NOTE — Progress Notes (Signed)
Denise Smith is a 55 y.o. female who presents to  [place] at [facility]  today, 04/23/21, seeking care for the following: multiple concerns, see headings below   Per MyChart message 04/17/21 "I will be there on July 11th and wanted to give you a heads up about things that I am wanting to address. I realize it is a lot, and I apologize in advance. I am having shoulder/neck pain and getting a dowagers hump:( My tinnitus is ramping up and I'm concerned about my hearing I am experiencing almost constant fatigue My bladder - I can't hold back and feel as though it never empties I am finding menopause to be quite challenging and perhaps connected to some of these things"      ASSESSMENT & PLAN with other pertinent findings:  The primary encounter diagnosis was Chronic fatigue. Diagnoses of Incomplete emptying of bladder, Atrophic vaginitis, Postmenopause, Chondromalacia of patellofemoral joint, left, and Tinnitus, unspecified laterality were also pertinent to this visit.   1. Chronic fatigue Labs as below Possible d/t menopausal metabolic changes No concern for depression/anxiety at this point   2. Incomplete emptying of bladder 3. Atrophic vaginitis 4. Postmenopause No s/s UTI, urge incontinence Sounds more likely cystocele or other anatomic issue Trial vaginal estrogen Would hold off on systemic estrogen given FH breast CA  5. Chondromalacia of patellofemoral joint, left Refilled Mobic Offered referral to PT / sports med Pt will consider this and get back to me  6. Tinnitus, unspecified laterality Offered ENT referral but not really any great treatments for this. No vertigo or hearing loss.   Patient Instructions  Shoulder/neck: refilled Mobic, let me know if you'd like to see Dr T / need PT referral.   Tinnitus: no cure for this condition.   Fatigue/Menopause, Urinary problems: Labs today. Menopause may be contributing  to this. We can try treating with vaginal estrogen, and/or can follow w/ OBGYN if this isn't helping .    Orders Placed This Encounter  Procedures   CBC   COMPLETE METABOLIC PANEL WITH GFR   TSH   VITAMIN D 25 Hydroxy (Vit-D Deficiency, Fractures)    Meds ordered this encounter  Medications   meloxicam (MOBIC) 15 MG tablet    Sig: Take 1 tablet (15 mg total) by mouth daily as needed for pain.    Dispense:  30 tablet    Refill:  3   conjugated estrogens (PREMARIN) vaginal cream    Sig: Place 1 Applicatorful vaginally at bedtime for 7 days, THEN 1 Applicatorful 3 (three) times a week.    Dispense:  42.5 g    Refill:  12     See below for relevant physical exam findings  See below for recent lab and imaging results reviewed  Medications, allergies, PMH, PSH, SocH, FamH reviewed below    Follow-up instructions: Return for RECHECK PENDING RESULTS / IF WORSE OR CHANGE.                                        Exam: BP 126/70   Pulse 71   Ht 5' 7"  (1.702 m)   Wt 177 lb (80.3 kg)   LMP 04/21/2013   BMI 27.72 kg/m  Constitutional: VS see above. General Appearance: alert, well-developed, well-nourished, NAD Neck: No masses, trachea midline.  Respiratory: Normal respiratory effort.  Musculoskeletal: Gait normal. Symmetric and  independent movement of all extremities Neurological: Normal balance/coordination.  Psychiatric: Normal judgment/insight. Normal mood and affect. Oriented x3.   Current Meds  Medication Sig   conjugated estrogens (PREMARIN) vaginal cream Place 1 Applicatorful vaginally at bedtime for 7 days, THEN 1 Applicatorful 3 (three) times a week.    No Known Allergies  Patient Active Problem List   Diagnosis Date Noted   Attention deficit disorder 06/12/2020   Right wrist pain 08/02/2019   Rotator cuff arthropathy, left 08/02/2019   Chronic migraine without aura without status migrainosus, not intractable 08/02/2019    Menopausal symptom 08/02/2019   Left foot pain 01/19/2018   Genetic testing 12/16/2017   Family history of breast cancer in sister    Family history of bladder cancer    Family history of leukemia    Family history of skin cancer    Family history of kidney cancer    Leiomyoma of uterus, unspecified 05/08/2013   Chondromalacia of patellofemoral joint, left 08/03/2008    Family History  Problem Relation Age of Onset   Breast cancer Mother 51   Bladder Cancer Father 41   Kidney cancer Father        dx 48's, thinks it was a second primary rather than met   Breast cancer Sister        dx 64's.    Leukemia Maternal Grandfather    Skin cancer Maternal Grandfather        'a couple removed in his 28's'   Heart disease Paternal Grandmother    Breast cancer Other    Breast cancer Other     Social History   Tobacco Use  Smoking Status Former   Pack years: 0.00   Types: Cigarettes   Quit date: 04/22/2008   Years since quitting: 13.0  Smokeless Tobacco Never    Past Surgical History:  Procedure Laterality Date   ABDOMINAL HYSTERECTOMY N/A 05/06/2013   Procedure: TOTAL ABDOMINAL HYSTERECTOMY;  Surgeon: Margarette Asal, MD;  Location: [facility];  Service: Gynecology;  Laterality: N/A;   COLON RESECTION  1997   LYSIS OF ADHESION N/A 05/06/2013   Procedure: LYSIS OF ADHESION;  Surgeon: Margarette Asal, MD;  Location: [facility];  Service: Gynecology;  Laterality: N/A;   MOUTH SURGERY      Immunization History  Administered Date(s) Administered   Influenza,inj,Quad PF,6+ Mos 06/12/2020   Influenza-Unspecified 07/02/2017, 07/14/2018   PFIZER(Purple Top)SARS-COV-2 Vaccination 10/05/2019, 10/23/2019, 07/24/2020   PPD Test 06/25/2017    No results found for this or any previous visit (from the past 2160 hour(s)).  No results found.     All questions at time of visit were answered - patient instructed to contact office with any additional concerns or updates. ER/RTC precautions  were reviewed with the patient as applicable.   Please note: manual typing as well as voice recognition software may have been used to produce this document - typos may escape review. Please contact Dr. Sheppard Coil for any needed clarifications.

## 2021-04-24 ENCOUNTER — Encounter: Payer: Self-pay | Admitting: Osteopathic Medicine

## 2021-04-24 LAB — CBC
HCT: 40.5 % (ref 35.0–45.0)
Hemoglobin: 13.3 g/dL (ref 11.7–15.5)
MCH: 29 pg (ref 27.0–33.0)
MCHC: 32.8 g/dL (ref 32.0–36.0)
MCV: 88.2 fL (ref 80.0–100.0)
MPV: 10.4 fL (ref 7.5–12.5)
Platelets: 393 10*3/uL (ref 140–400)
RBC: 4.59 10*6/uL (ref 3.80–5.10)
RDW: 12.6 % (ref 11.0–15.0)
WBC: 6.1 10*3/uL (ref 3.8–10.8)

## 2021-04-24 LAB — COMPLETE METABOLIC PANEL WITH GFR
AG Ratio: 2 (calc) (ref 1.0–2.5)
ALT: 20 U/L (ref 6–29)
AST: 23 U/L (ref 10–35)
Albumin: 4.5 g/dL (ref 3.6–5.1)
Alkaline phosphatase (APISO): 79 U/L (ref 37–153)
BUN: 9 mg/dL (ref 7–25)
CO2: 31 mmol/L (ref 20–32)
Calcium: 10.1 mg/dL (ref 8.6–10.4)
Chloride: 103 mmol/L (ref 98–110)
Creat: 0.68 mg/dL (ref 0.50–1.03)
Globulin: 2.2 g/dL (calc) (ref 1.9–3.7)
Glucose, Bld: 71 mg/dL (ref 65–99)
Potassium: 4.5 mmol/L (ref 3.5–5.3)
Sodium: 140 mmol/L (ref 135–146)
Total Bilirubin: 0.3 mg/dL (ref 0.2–1.2)
Total Protein: 6.7 g/dL (ref 6.1–8.1)
eGFR: 103 mL/min/{1.73_m2} (ref >=60)

## 2021-04-24 LAB — VITAMIN D 25 HYDROXY (VIT D DEFICIENCY, FRACTURES): Vit D, 25-Hydroxy: 38 ng/mL (ref 30–100)

## 2021-04-24 LAB — TSH: TSH: 2.15 mIU/L

## 2021-05-08 MED ORDER — ESTRADIOL 0.1 MG/GM VA CREA
1.0000 | TOPICAL_CREAM | Freq: Every day | VAGINAL | 12 refills | Status: DC
Start: 2021-05-08 — End: 2023-11-06

## 2021-10-25 LAB — HM HEPATITIS C SCREENING LAB: HM Hepatitis Screen: NEGATIVE

## 2021-12-21 DIAGNOSIS — N3281 Overactive bladder: Secondary | ICD-10-CM | POA: Insufficient documentation

## 2021-12-21 DIAGNOSIS — L900 Lichen sclerosus et atrophicus: Secondary | ICD-10-CM | POA: Insufficient documentation

## 2023-07-08 DIAGNOSIS — N951 Menopausal and female climacteric states: Secondary | ICD-10-CM | POA: Diagnosis not present

## 2023-07-08 DIAGNOSIS — Z79890 Hormone replacement therapy (postmenopausal): Secondary | ICD-10-CM | POA: Diagnosis not present

## 2023-07-08 DIAGNOSIS — Z1331 Encounter for screening for depression: Secondary | ICD-10-CM | POA: Diagnosis not present

## 2023-07-10 ENCOUNTER — Other Ambulatory Visit (HOSPITAL_COMMUNITY): Payer: Self-pay

## 2023-07-10 MED ORDER — ESTRADIOL 0.0375 MG/24HR TD PTTW
1.0000 | MEDICATED_PATCH | TRANSDERMAL | 1 refills | Status: DC
  Filled 2023-07-10: qty 8, 28d supply, fill #0
  Filled 2023-08-03 – 2023-10-30 (×2): qty 8, 28d supply, fill #1

## 2023-07-10 MED ORDER — ESTRADIOL 0.1 MG/GM VA CREA
TOPICAL_CREAM | VAGINAL | 0 refills | Status: DC
  Filled 2023-07-10: qty 42.5, 90d supply, fill #0

## 2023-07-11 ENCOUNTER — Other Ambulatory Visit (HOSPITAL_COMMUNITY): Payer: Self-pay

## 2023-08-04 ENCOUNTER — Other Ambulatory Visit: Payer: Self-pay

## 2023-08-06 ENCOUNTER — Other Ambulatory Visit: Payer: Self-pay

## 2023-08-13 ENCOUNTER — Other Ambulatory Visit (HOSPITAL_COMMUNITY): Payer: Self-pay

## 2023-08-13 DIAGNOSIS — Z1231 Encounter for screening mammogram for malignant neoplasm of breast: Secondary | ICD-10-CM | POA: Diagnosis not present

## 2023-08-13 DIAGNOSIS — Z1331 Encounter for screening for depression: Secondary | ICD-10-CM | POA: Diagnosis not present

## 2023-08-13 DIAGNOSIS — Z01419 Encounter for gynecological examination (general) (routine) without abnormal findings: Secondary | ICD-10-CM | POA: Diagnosis not present

## 2023-08-13 LAB — HM MAMMOGRAPHY

## 2023-08-14 ENCOUNTER — Other Ambulatory Visit (HOSPITAL_COMMUNITY): Payer: Self-pay

## 2023-08-14 ENCOUNTER — Other Ambulatory Visit: Payer: Self-pay

## 2023-08-14 MED ORDER — ESTRADIOL 0.1 MG/GM VA CREA
TOPICAL_CREAM | VAGINAL | 4 refills | Status: DC
  Filled 2023-08-14 – 2023-10-30 (×2): qty 42.5, 90d supply, fill #0

## 2023-08-14 MED ORDER — CLOBETASOL PROPIONATE 0.025 % EX CREA
TOPICAL_CREAM | CUTANEOUS | 0 refills | Status: DC
  Filled 2023-10-27: qty 60, fill #0

## 2023-08-14 MED ORDER — ESTRADIOL 0.0375 MG/24HR TD PTTW
1.0000 | MEDICATED_PATCH | TRANSDERMAL | 3 refills | Status: DC
  Filled 2023-08-14: qty 24, 84d supply, fill #0

## 2023-08-18 ENCOUNTER — Other Ambulatory Visit (HOSPITAL_COMMUNITY): Payer: Self-pay

## 2023-10-27 ENCOUNTER — Other Ambulatory Visit (HOSPITAL_COMMUNITY): Payer: Self-pay

## 2023-10-30 ENCOUNTER — Other Ambulatory Visit (HOSPITAL_COMMUNITY): Payer: Self-pay

## 2023-10-30 ENCOUNTER — Other Ambulatory Visit: Payer: Self-pay

## 2023-11-05 ENCOUNTER — Ambulatory Visit (HOSPITAL_BASED_OUTPATIENT_CLINIC_OR_DEPARTMENT_OTHER): Payer: No Typology Code available for payment source | Admitting: Family Medicine

## 2023-11-06 ENCOUNTER — Other Ambulatory Visit (HOSPITAL_COMMUNITY): Payer: Self-pay

## 2023-11-06 ENCOUNTER — Other Ambulatory Visit: Payer: Self-pay

## 2023-11-06 ENCOUNTER — Encounter (HOSPITAL_BASED_OUTPATIENT_CLINIC_OR_DEPARTMENT_OTHER): Payer: Self-pay | Admitting: Family Medicine

## 2023-11-06 ENCOUNTER — Encounter (HOSPITAL_BASED_OUTPATIENT_CLINIC_OR_DEPARTMENT_OTHER): Payer: Self-pay | Admitting: *Deleted

## 2023-11-06 ENCOUNTER — Ambulatory Visit (HOSPITAL_BASED_OUTPATIENT_CLINIC_OR_DEPARTMENT_OTHER): Payer: 59 | Admitting: Family Medicine

## 2023-11-06 VITALS — BP 118/78 | HR 74 | Ht 66.5 in | Wt 180.2 lb

## 2023-11-06 DIAGNOSIS — N951 Menopausal and female climacteric states: Secondary | ICD-10-CM | POA: Diagnosis not present

## 2023-11-06 DIAGNOSIS — E349 Endocrine disorder, unspecified: Secondary | ICD-10-CM

## 2023-11-06 DIAGNOSIS — E663 Overweight: Secondary | ICD-10-CM | POA: Diagnosis not present

## 2023-11-06 DIAGNOSIS — Z1211 Encounter for screening for malignant neoplasm of colon: Secondary | ICD-10-CM

## 2023-11-06 DIAGNOSIS — Z0000 Encounter for general adult medical examination without abnormal findings: Secondary | ICD-10-CM

## 2023-11-06 MED ORDER — ESTRADIOL 0.0375 MG/24HR TD PTTW: 1.0000 | MEDICATED_PATCH | TRANSDERMAL | 3 refills | Status: DC

## 2023-11-06 MED ORDER — ESTRADIOL 0.1 MG/GM VA CREA: TOPICAL_CREAM | VAGINAL | 4 refills | Status: DC

## 2023-11-06 MED ORDER — ESTRADIOL 0.0375 MG/24HR TD PTTW
1.0000 | MEDICATED_PATCH | TRANSDERMAL | 3 refills | Status: DC
  Filled 2023-11-06: qty 24, 84d supply, fill #0

## 2023-11-06 MED ORDER — CLOBETASOL PROPIONATE 0.025 % EX CREA
TOPICAL_CREAM | CUTANEOUS | 0 refills | Status: DC
  Filled 2023-11-06: qty 60, fill #0

## 2023-11-06 MED ORDER — CLOBETASOL PROPIONATE 0.025 % EX CREA: TOPICAL_CREAM | CUTANEOUS | 0 refills | Status: DC

## 2023-11-06 MED ORDER — IPRATROPIUM BROMIDE 0.06 % NA SOLN: 1.0000 | Freq: Two times a day (BID) | NASAL | 1 refills | Status: DC

## 2023-11-06 MED ORDER — IPRATROPIUM BROMIDE 0.06 % NA SOLN
1.0000 | Freq: Two times a day (BID) | NASAL | 1 refills | Status: DC
  Filled 2023-11-06: qty 15, 75d supply, fill #0

## 2023-11-06 NOTE — Progress Notes (Signed)
New Patient Office Visit  Subjective   Patient ID: Denise Smith, female    DOB: 24-Jul-1966  Age: 58 y.o. MRN: [id number]  CC:  Chief Complaint  Patient presents with   New Patient (Initial Visit)    New Patient establishing care is due for medication refills but wants sent through different pharmacies wants estrace [facility]     HPI Adana Marik presents to establish care Last PCP - through [facility], Rueben Bash  About 1 year ago, she did go to [facility] as she had difficulty with finding provider who would continue her on HRT. She is wanting to continue with HRT. Was seeing OB/GYN (Dr. Amado Nash), but plans to establish with new provider - open to referral to [facility]. Notes with HRT that she has felt better from a joint pain standpoint and mood standpoint.  Has concerns about weight and wanting to work towards weight loss.  Patient is originally from [place], [place], has lived here for about 20 years ago.  Patient works as Engineer, civil (consulting) in [employer]. She enjoys sewing, crafts, hiking, outdoor activities.  Outpatient Encounter Medications as of 11/06/2023  Medication Sig   Testosterone 20 % CREA apply 1-2 clicks (1/4-1/2 gram) topically once a day behind the knee. Decrease to 1 click for oily skin, acne or hair growth   [DISCONTINUED] Clobetasol Propionate 0.025 % CREA Apply a thin layer to affected area 2 times a day as needed for itching   [DISCONTINUED] estradiol (ESTRACE) 0.1 MG/GM vaginal cream Insert 1 g twice a week by vaginal route as directed.   [DISCONTINUED] estradiol (VIVELLE-DOT) 0.0375 MG/24HR Place 1 patch onto the skin 2 (two) times a week.   [DISCONTINUED] ipratropium (ATROVENT) 0.06 % nasal spray Administer 2 sprays into affected nostril 4 (four)  times daily as needed for Rhinitis.   Clobetasol Propionate 0.025 % CREA Apply a thin layer to affected area 2 times a day as needed for itching   estradiol (ESTRACE) 0.1  MG/GM vaginal cream Insert 1 g twice a week by vaginal route as directed.   estradiol (VIVELLE-DOT) 0.0375 MG/24HR Place 1 patch onto the skin 2 (two) times a week.   ipratropium (ATROVENT) 0.06 % nasal spray Place 1 spray into both nostrils 2 (two) times daily.   meloxicam (MOBIC) 15 MG tablet Take 1 tablet (15 mg total) by mouth daily as needed for pain. (Patient not taking: Reported on 11/06/2023)   [DISCONTINUED] Clobetasol Propionate 0.025 % CREA Apply a thin layer to affected area 2 times a day as needed for itching   [DISCONTINUED] estradiol (ESTRACE VAGINAL) 0.1 MG/GM vaginal cream Place 1 Applicatorful vaginally at bedtime. Daily for the first week and after that 1-3 nights per week for maintenance (Patient not taking: Reported on 11/06/2023)   [DISCONTINUED] estradiol (ESTRACE) 0.1 MG/GM vaginal cream Insert 1 g twice a week by vaginal route as directed.   [DISCONTINUED] estradiol (VIVELLE-DOT) 0.0375 MG/24HR Place 1 patch onto the skin 2 (two) times a week. (Patient not taking: Reported on 11/06/2023)   [DISCONTINUED] estradiol (VIVELLE-DOT) 0.0375 MG/24HR Place 1 patch onto the skin 2 (two) times a week.   [DISCONTINUED] ipratropium (ATROVENT) 0.06 % nasal spray Place 1 spray into both nostrils 2 (two) times daily.   No facility-administered encounter medications on file as of 11/06/2023.    Past Medical History:  Diagnosis Date   Anemia    Family history of bladder cancer    Family history of breast cancer  Family history of kidney cancer    Family history of leukemia    Family history of skin cancer    Headache(784.0)     Past Surgical History:  Procedure Laterality Date   ABDOMINAL HYSTERECTOMY N/A 05/06/2013   Procedure: TOTAL ABDOMINAL HYSTERECTOMY;  Surgeon: Meriel Pica, MD;  Location: [facility];  Service: Gynecology;  Laterality: N/A;   COLON RESECTION  1997   LYSIS OF ADHESION N/A 05/06/2013   Procedure: LYSIS OF ADHESION;  Surgeon: Meriel Pica, MD;   Location: [facility];  Service: Gynecology;  Laterality: N/A;   MOUTH SURGERY     TOTAL ABDOMINAL HYSTERECTOMY      Family History  Problem Relation Age of Onset   Breast cancer Mother 56   Bladder Cancer Father 2   Kidney cancer Father        dx 22's, thinks it was a second primary rather than met   Breast cancer Sister        dx 22's.    Leukemia Maternal Grandfather    Skin cancer Maternal Grandfather        'a couple removed in his 26's'   Heart disease Paternal Grandmother    Breast cancer Other    Breast cancer Other     Social History   Socioeconomic History   Marital status: Married    Spouse name: Not on file   Number of children: Not on file   Years of education: Not on file   Highest education level: Bachelor's degree (e.g., BA, AB, BS)  Occupational History   Not on file  Tobacco Use   Smoking status: Former    Current packs/day: 0.00    Types: Cigarettes    Quit date: 04/22/2008    Years since quitting: 15.5    Passive exposure: Past   Smokeless tobacco: Never  Vaping Use   Vaping status: Never Used  Substance and Sexual Activity   Alcohol use: No   Drug use: No   Sexual activity: Yes    Partners: Male    Birth control/protection: Surgical  Other Topics Concern   Not on file  Social History Narrative   Not on file   Social Drivers of Health   Financial Resource Strain: Low Risk  (11/04/2023)   Overall Financial Resource Strain (CARDIA)    Difficulty of Paying Living Expenses: Not very hard  Food Insecurity: No Food Insecurity (11/04/2023)   Hunger Vital Sign    Worried About Running Out of Food in the Last Year: Never true    Ran Out of Food in the Last Year: Never true  Transportation Needs: No Transportation Needs (11/04/2023)   PRAPARE - Administrator, Civil Service (Medical): No    Lack of Transportation (Non-Medical): No  Physical Activity: Insufficiently Active (11/04/2023)   Exercise Vital Sign    Days of Exercise per Week: 3  days    Minutes of Exercise per Session: 40 min  Stress: Stress Concern Present (11/04/2023)   [employer] of Occupational Health - Occupational Stress Questionnaire    Feeling of Stress : To some extent  Social Connections: Moderately Integrated (11/04/2023)   Social Connection and Isolation Panel [NHANES]    Frequency of Communication with Friends and Family: More than three times a week    Frequency of Social Gatherings with Friends and Family: Once a week    Attends Religious Services: Never    Database administrator or Organizations: Yes    Attends  Engineer, structural: More than 4 times per year    Marital Status: Married  Catering manager Violence: Not on file    Objective   BP 118/78 (BP Location: Right Arm, Patient Position: Sitting, Cuff Size: Normal)   Pulse 74   Ht 5' 6.5" (1.689 m)   Wt 180 lb 3.2 oz (81.7 kg)   LMP 04/21/2013   SpO2 98%   BMI 28.65 kg/m   Physical Exam  58 year old female in no acute distress Cardiovascular exam with regular rate and rhythm Lungs clear to auscultation bilaterally  Assessment & Plan:   Menopausal symptom Assessment & Plan: Referral placed for patient to establish with OB/GYN.  She was receiving hormone therapy through [facility].  Discussed that she can review medications with OB/GYN regarding continuation of these medications or possible alternatives.  Did provide refill today until patient is able to establish with OB/GYN to further discuss.  She additionally has questions about possible testosterone deficiency, can proceed with labs today with plan for patient to discuss further with OB/GYN.  Orders: -     Ambulatory referral to Obstetrics / Gynecology  Colon cancer screening -     Ambulatory referral to Gastroenterology  Testosterone deficiency -     Testosterone  Wellness examination -     CBC with Differential/Platelet; Future -     Comprehensive metabolic panel; Future -     Hemoglobin  A1c; Future -     Lipid panel; Future -     TSH Rfx on Abnormal to Free T4; Future  Overweight (BMI 25.0-29.9) Assessment & Plan: Did review general recommendations related to weight management.  Discussed conservative measures, lifestyle modifications.  Also reviewed general indications related to pharmacotherapy to assist with weight loss and situations when lifestyle modifications are not leading to appreciable improvements.  Currently, her BMI is low enough that she ultimately would not be a candidate for a pharmacotherapy based on recommendations and guidelines.  Discussed this with patient today.   Other orders -     Clobetasol Propionate; Apply a thin layer to affected area 2 times a day as needed for itching  Dispense: 60 g; Refill: 0 -     Estradiol; Insert 1 g twice a week by vaginal route as directed.  Dispense: 42.5 g; Refill: 4 -     Estradiol; Place 1 patch onto the skin 2 (two) times a week.  Dispense: 24 patch; Refill: 3 -     Ipratropium Bromide; Place 1 spray into both nostrils 2 (two) times daily.  Dispense: 15 mL; Refill: 1  Return in about 3 months (around 02/04/2024) for CPE with fasting labs 1 week prior.   Spent 46 minutes on this patient encounter, including preparation, chart review, face-to-face counseling with patient and coordination of care, and documentation of encounter   ___________________________________________ Tamsyn Owusu de [place], MD, ABFM, [facility]

## 2023-11-06 NOTE — Patient Instructions (Signed)
  Medication Instructions:  Your physician recommends that you continue on your current medications as directed. Please refer to the Current Medication list given to you today. --If you need a refill on any your medications before your next appointment, please call your pharmacy first. If no refills are authorized on file call the office.-- Lab Work: Your physician has recommended that you have lab work today: 1 week prior to physical  If you have labs (blood work) drawn today and your tests are completely normal, you will receive your results via MyChart message OR a phone call from our staff.  Please ensure you check your voicemail in the event that you authorized detailed messages to be left on a delegated number. If you have any lab test that is abnormal or we need to change your treatment, we will call you to review the results.  Referrals/Procedures/Imaging: Obgyn, GI  Follow-Up: Your next appointment:   Your physician recommends that you schedule a follow-up appointment in: 3-4 months for Physical  with Dr. de Peru  You will receive a text message or e-mail with a link to a survey about your care and experience with [place] today! We would greatly appreciate your feedback!   Thanks for letting us be apart of your health journey!!  Primary Care and Sports Medicine   Dr. Ceasar Mons [place]   We encourage you to activate your patient portal called "MyChart".  Sign up information is provided on this After Visit Summary.  MyChart is used to connect with patients for Virtual Visits (Telemedicine).  Patients are able to view lab/test results, encounter notes, upcoming appointments, etc.  Non-urgent messages can be sent to your provider as well. To learn more about what you can do with MyChart, please visit --  [email or web address].

## 2023-11-07 ENCOUNTER — Other Ambulatory Visit (HOSPITAL_COMMUNITY): Payer: Self-pay

## 2023-11-07 LAB — TESTOSTERONE: Testosterone: 15 ng/dL (ref 4–50)

## 2023-11-10 ENCOUNTER — Other Ambulatory Visit (HOSPITAL_COMMUNITY): Payer: Self-pay

## 2023-11-11 DIAGNOSIS — E663 Overweight: Secondary | ICD-10-CM | POA: Insufficient documentation

## 2023-11-11 NOTE — Assessment & Plan Note (Signed)
Referral placed for patient to establish with OB/GYN.  She was receiving hormone therapy through [facility].  Discussed that she can review medications with OB/GYN regarding continuation of these medications or possible alternatives.  Did provide refill today until patient is able to establish with OB/GYN to further discuss.  She additionally has questions about possible testosterone deficiency, can proceed with labs today with plan for patient to discuss further with OB/GYN.

## 2023-11-11 NOTE — Assessment & Plan Note (Signed)
Did review general recommendations related to weight management.  Discussed conservative measures, lifestyle modifications.  Also reviewed general indications related to pharmacotherapy to assist with weight loss and situations when lifestyle modifications are not leading to appreciable improvements.  Currently, her BMI is low enough that she ultimately would not be a candidate for a pharmacotherapy based on recommendations and guidelines.  Discussed this with patient today.

## 2023-11-12 ENCOUNTER — Encounter: Payer: Self-pay | Admitting: Obstetrics and Gynecology

## 2023-11-19 ENCOUNTER — Other Ambulatory Visit: Payer: Self-pay | Admitting: Medical Genetics

## 2023-11-24 ENCOUNTER — Other Ambulatory Visit (HOSPITAL_COMMUNITY)
Admission: RE | Admit: 2023-11-24 | Discharge: 2023-11-24 | Disposition: A | Discharge status: Home / Self Care | Payer: Self-pay | Source: Ambulatory Visit | Attending: Oncology | Admitting: Oncology

## 2023-11-24 DIAGNOSIS — Z006 Encounter for examination for normal comparison and control in clinical research program: Secondary | ICD-10-CM | POA: Insufficient documentation

## 2023-11-26 ENCOUNTER — Other Ambulatory Visit (HOSPITAL_BASED_OUTPATIENT_CLINIC_OR_DEPARTMENT_OTHER): Payer: Self-pay | Admitting: Family Medicine

## 2023-11-26 ENCOUNTER — Other Ambulatory Visit (HOSPITAL_COMMUNITY): Payer: Self-pay

## 2023-11-26 ENCOUNTER — Encounter (HOSPITAL_BASED_OUTPATIENT_CLINIC_OR_DEPARTMENT_OTHER): Payer: Self-pay | Admitting: Family Medicine

## 2023-11-26 MED ORDER — BETAMETHASONE DIPROPIONATE 0.05 % EX CREA
TOPICAL_CREAM | Freq: Two times a day (BID) | CUTANEOUS | 0 refills | Status: AC | PRN
  Filled 2023-11-26: qty 60, 30d supply, fill #0

## 2023-11-26 MED ORDER — ESTRADIOL 0.0375 MG/24HR TD PTTW
1.0000 | MEDICATED_PATCH | TRANSDERMAL | 3 refills | Status: DC
  Filled 2023-11-26: qty 24, 84d supply, fill #0

## 2023-11-26 MED ORDER — ESTRADIOL 0.1 MG/GM VA CREA
1.0000 g | TOPICAL_CREAM | VAGINAL | 4 refills | Status: DC
  Filled 2023-11-26: qty 42.5, 90d supply, fill #0

## 2023-11-26 MED ORDER — IPRATROPIUM BROMIDE 0.06 % NA SOLN
1.0000 | Freq: Two times a day (BID) | NASAL | 1 refills | Status: DC
  Filled 2023-11-26: qty 15, 41d supply, fill #0
  Filled 2024-01-11 – 2024-01-22 (×2): qty 15, 41d supply, fill #1

## 2023-11-27 ENCOUNTER — Other Ambulatory Visit: Payer: Self-pay

## 2023-12-01 ENCOUNTER — Other Ambulatory Visit (HOSPITAL_BASED_OUTPATIENT_CLINIC_OR_DEPARTMENT_OTHER): Payer: Self-pay | Admitting: *Deleted

## 2023-12-01 ENCOUNTER — Other Ambulatory Visit (HOSPITAL_COMMUNITY): Payer: Self-pay

## 2023-12-03 ENCOUNTER — Other Ambulatory Visit: Payer: Self-pay | Admitting: Medical Genetics

## 2023-12-03 DIAGNOSIS — Z006 Encounter for examination for normal comparison and control in clinical research program: Secondary | ICD-10-CM | POA: Insufficient documentation

## 2023-12-03 NOTE — Progress Notes (Signed)
 New order needed. Confirmed consent on file

## 2023-12-05 ENCOUNTER — Telehealth: Payer: Self-pay | Admitting: Gastroenterology

## 2023-12-05 NOTE — Telephone Encounter (Signed)
Patient was seen by Dr. Loreta Ave with [facility]. Due to her insurance [facility] is not accepting her. Patient is wanting to establish her care with Dr. Rhea Belton for a colonoscopy. Please advise.

## 2023-12-10 ENCOUNTER — Encounter (HOSPITAL_BASED_OUTPATIENT_CLINIC_OR_DEPARTMENT_OTHER): Payer: Self-pay | Admitting: Certified Nurse Midwife

## 2023-12-10 ENCOUNTER — Other Ambulatory Visit (HOSPITAL_COMMUNITY): Payer: Self-pay

## 2023-12-10 ENCOUNTER — Other Ambulatory Visit (HOSPITAL_BASED_OUTPATIENT_CLINIC_OR_DEPARTMENT_OTHER): Payer: Self-pay

## 2023-12-10 ENCOUNTER — Ambulatory Visit (INDEPENDENT_AMBULATORY_CARE_PROVIDER_SITE_OTHER): Payer: 59 | Admitting: Certified Nurse Midwife

## 2023-12-10 VITALS — BP 150/96 | HR 70 | Ht 66.5 in | Wt 183.0 lb

## 2023-12-10 DIAGNOSIS — Z789 Other specified health status: Secondary | ICD-10-CM

## 2023-12-10 DIAGNOSIS — Z1283 Encounter for screening for malignant neoplasm of skin: Secondary | ICD-10-CM

## 2023-12-10 DIAGNOSIS — Z8262 Family history of osteoporosis: Secondary | ICD-10-CM | POA: Diagnosis not present

## 2023-12-10 DIAGNOSIS — Z808 Family history of malignant neoplasm of other organs or systems: Secondary | ICD-10-CM

## 2023-12-10 DIAGNOSIS — Z803 Family history of malignant neoplasm of breast: Secondary | ICD-10-CM

## 2023-12-10 DIAGNOSIS — Z0000 Encounter for general adult medical examination without abnormal findings: Secondary | ICD-10-CM | POA: Diagnosis not present

## 2023-12-10 MED ORDER — TRIAMCINOLONE ACETONIDE 0.5 % EX OINT: 1.0000 | TOPICAL_OINTMENT | Freq: Two times a day (BID) | CUTANEOUS | 3 refills | Status: DC

## 2023-12-10 MED ORDER — ESTRADIOL 0.1 MG/GM VA CREA
1.0000 g | TOPICAL_CREAM | VAGINAL | 4 refills | Status: DC
  Filled 2023-12-10: qty 42.5, 147d supply, fill #0
  Filled 2024-02-23: qty 42.5, 90d supply, fill #0
  Filled 2024-05-01 – 2024-05-07 (×2): qty 42.5, 90d supply, fill #1
  Filled 2024-07-20 – 2024-07-29 (×2): qty 42.5, 90d supply, fill #2

## 2023-12-10 MED ORDER — ESTRADIOL 0.0375 MG/24HR TD PTTW
1.0000 | MEDICATED_PATCH | TRANSDERMAL | 3 refills | Status: AC
  Filled 2023-12-10 – 2024-03-28 (×2): qty 24, 84d supply, fill #0
  Filled 2024-07-14: qty 24, 84d supply, fill #1
  Filled 2024-10-13: qty 24, 84d supply, fill #2

## 2023-12-10 NOTE — Progress Notes (Unsigned)
  Subjective:    Thera is a 58yo postmenopausal MWF G0 here to establish care. Vegan. Pt states she had annual gyn exam with Dr. Amado Nash around Aug-September 2024. Hysterectomy was due to fibroids. Ovaries intact. She denies vaginal spotting or bleeding. Pt is on Testosterone Cream and would  like this refilled.  She is on Estradiol .375mg  patch twice weekly and this has helped with hot flashes and night sweats. Has a small patch of what appears to be eczema left arm flexural space.   Family Hx Breast Cancer. Pt recently underwent genetic testing and is waiting those results. Family Hx Skin Cancer. She would like to establish care w/ Dermatology. Fam Hx Osteoporosis in her Mom (younger age).   The following portions of the patient's history were reviewed and updated as appropriate: allergies, current medications, past family history, past medical history, past social history, past surgical history, and problem list.   Review of Systems Pertinent items are noted in HPI.    Objective:    BP (!) 150/96 (BP Location: Right Arm, Patient Position: Sitting)   Pulse 70   Ht 5' 6.5" (1.689 m)   Wt 183 lb (83 kg)   LMP 04/21/2013   BMI 29.09 kg/m  General appearance: alert, cooperative, and appears stated age Skin:  small patch eczematous skin changed left arm-flexural eczema.     Assessment:    Postmenopausal Vasomotor Symptoms (Hot Flashes/Night Sweats) - HT Hx Low Libido Family Hx Skin Cancer Family Hx Breast Cancer    Plan:    May continue Estradiol .375mg  patch twice weekly. Pt would like to continue Testosterone 2% Cream, 15gm, apply 1-2 clicks daily behind the knee. (Refill sent to [facility]) May try Triamcinolone .05% to eczema left arm. Referral placed to Dermatology (Skin Cancer Screening) Referral placed by PCP for Colonoscopy for Colon Cancer Screening Vaccinations UTD (Shingles, Tdap, Flu) Routine screening labs collected today  RTO 1 year for annual gyn exam and prn if  issues arise. Letta Kocher

## 2023-12-11 ENCOUNTER — Encounter (HOSPITAL_BASED_OUTPATIENT_CLINIC_OR_DEPARTMENT_OTHER): Payer: Self-pay | Admitting: Family Medicine

## 2023-12-11 LAB — COMPREHENSIVE METABOLIC PANEL
ALT: 16 [IU]/L (ref 0–32)
AST: 19 [IU]/L (ref 0–40)
Albumin: 4.6 g/dL (ref 3.8–4.9)
Alkaline Phosphatase: 68 [IU]/L (ref 44–121)
BUN/Creatinine Ratio: 10 (ref 9–23)
BUN: 7 mg/dL (ref 6–24)
Bilirubin Total: 0.4 mg/dL (ref 0.0–1.2)
CO2: 23 mmol/L (ref 20–29)
Calcium: 9.6 mg/dL (ref 8.7–10.2)
Chloride: 103 mmol/L (ref 96–106)
Creatinine, Ser: 0.67 mg/dL (ref 0.57–1.00)
Globulin, Total: 2.2 g/dL (ref 1.5–4.5)
Glucose: 92 mg/dL (ref 70–99)
Potassium: 4.4 mmol/L (ref 3.5–5.2)
Sodium: 139 mmol/L (ref 134–144)
Total Protein: 6.8 g/dL (ref 6.0–8.5)
eGFR: 102 mL/min/{1.73_m2} (ref >59)

## 2023-12-11 LAB — CBC
Hematocrit: 41.2 % (ref 34.0–46.6)
Hemoglobin: 13.8 g/dL (ref 11.1–15.9)
MCH: 29.7 pg (ref 26.6–33.0)
MCHC: 33.5 g/dL (ref 31.5–35.7)
MCV: 89 fL (ref 79–97)
Platelets: 383 10*3/uL (ref 150–450)
RBC: 4.65 x10E6/uL (ref 3.77–5.28)
RDW: 12.2 % (ref 11.7–15.4)
WBC: 5 10*3/uL (ref 3.4–10.8)

## 2023-12-11 LAB — TSH RFX ON ABNORMAL TO FREE T4: TSH: 1.94 u[IU]/mL (ref 0.450–4.500)

## 2023-12-11 LAB — LIPID PANEL WITH LDL/HDL RATIO
Cholesterol, Total: 234 mg/dL — ABNORMAL HIGH (ref 100–199)
HDL: 54 mg/dL (ref >39)
LDL Chol Calc (NIH): 164 mg/dL — ABNORMAL HIGH (ref 0–99)
LDL/HDL Ratio: 3 {ratio} (ref 0.0–3.2)
Triglycerides: 93 mg/dL (ref 0–149)
VLDL Cholesterol Cal: 16 mg/dL (ref 5–40)

## 2023-12-11 LAB — HEMOGLOBIN A1C
Est. average glucose Bld gHb Est-mCnc: 117 mg/dL
Hgb A1c MFr Bld: 5.7 % — ABNORMAL HIGH (ref 4.8–5.6)

## 2023-12-11 LAB — VITAMIN B12: Vitamin B-12: 437 pg/mL (ref 232–1245)

## 2023-12-11 LAB — VITAMIN D 25 HYDROXY (VIT D DEFICIENCY, FRACTURES): Vit D, 25-Hydroxy: 30.4 ng/mL (ref 30.0–100.0)

## 2023-12-12 LAB — GENECONNECT MOLECULAR SCREEN: Genetic Analysis Overall Interpretation: NEGATIVE

## 2023-12-16 ENCOUNTER — Telehealth: Payer: Self-pay | Admitting: Internal Medicine

## 2023-12-16 NOTE — Telephone Encounter (Signed)
 Left message on machine to call back

## 2023-12-16 NOTE — Telephone Encounter (Signed)
 Patient requesting surveillance colonoscopy with me, last colonoscopy December 2019 One 7 mm adenomatous polyp removed, one 2 mm hyperplastic polyp removed from the rectum Current guidelines support surveillance colonoscopy at 7 years which would be December 2026 Given the clinical nature of this information I have included nursing to contact the patient to let her know of the recall colonoscopy.  If there is a factor that I am not aware of such as family history of colon cancer please let me know Thank you JMP

## 2023-12-16 NOTE — Telephone Encounter (Signed)
 Good morning Dr. Rhea Belton,  We received a call from this patient wishing to schedule a colonoscopy with you. Patient last had colonoscopy in 09/2018 with Dr. Loreta Ave [facility] She is no longer wishing to continue care with them and would like to schedule colonoscopy with you. Records were obtained and scanned into Media for you to review. Would you please advise on scheduling?  Thank you.

## 2023-12-18 NOTE — Telephone Encounter (Signed)
 Spoke to patient to advise of Dr Lauro Franklin response/recommendations. She verbalizes understanding. Recall colonoscopy placed for 09/2025. Patient denies any previous family history of colon cancer.

## 2023-12-18 NOTE — Telephone Encounter (Signed)
 Left message for patient to call back

## 2024-01-13 ENCOUNTER — Encounter (HOSPITAL_BASED_OUTPATIENT_CLINIC_OR_DEPARTMENT_OTHER): Payer: 59 | Admitting: Family Medicine

## 2024-01-15 ENCOUNTER — Encounter (HOSPITAL_BASED_OUTPATIENT_CLINIC_OR_DEPARTMENT_OTHER): Payer: 59 | Admitting: Family Medicine

## 2024-01-22 ENCOUNTER — Other Ambulatory Visit (HOSPITAL_COMMUNITY): Payer: Self-pay

## 2024-01-28 ENCOUNTER — Other Ambulatory Visit (HOSPITAL_COMMUNITY): Payer: Self-pay

## 2024-01-28 ENCOUNTER — Ambulatory Visit (INDEPENDENT_AMBULATORY_CARE_PROVIDER_SITE_OTHER): Payer: 59 | Admitting: Family Medicine

## 2024-01-28 ENCOUNTER — Encounter (HOSPITAL_BASED_OUTPATIENT_CLINIC_OR_DEPARTMENT_OTHER): Payer: Self-pay | Admitting: Family Medicine

## 2024-01-28 VITALS — BP 117/71 | HR 75 | Ht 66.5 in | Wt 179.0 lb

## 2024-01-28 DIAGNOSIS — E7800 Pure hypercholesterolemia, unspecified: Secondary | ICD-10-CM | POA: Diagnosis not present

## 2024-01-28 DIAGNOSIS — Z0000 Encounter for general adult medical examination without abnormal findings: Secondary | ICD-10-CM

## 2024-01-28 DIAGNOSIS — E663 Overweight: Secondary | ICD-10-CM

## 2024-01-28 DIAGNOSIS — R7303 Prediabetes: Secondary | ICD-10-CM | POA: Insufficient documentation

## 2024-01-28 DIAGNOSIS — I209 Angina pectoris, unspecified: Secondary | ICD-10-CM | POA: Diagnosis not present

## 2024-01-28 LAB — TROPONIN T: Troponin T (Highly Sensitive): <6 ng/L (ref 0–14)

## 2024-01-28 MED ORDER — NITROGLYCERIN 0.4 MG SL SUBL
0.4000 mg | SUBLINGUAL_TABLET | SUBLINGUAL | 3 refills | Status: AC | PRN
  Filled 2024-01-28: qty 50, 16d supply, fill #0

## 2024-01-28 NOTE — Progress Notes (Signed)
 Troponin unremarkable

## 2024-01-28 NOTE — Progress Notes (Signed)
 Subjective:   Denise Smith [birth year]  01/28/2024   CC: Chief Complaint  Patient presents with   Annual Exam    Patient is here today for her physical.  States that she has been having problems sleeping and also with trying to lose weight.    HPI: Denise Smith is a 58 y.o. female who presents for a routine health maintenance exam. She has the following concerns:   WEIGHT MANAGEMENT: Denise Smith presents for weight management. She states she is concerned her weight is increasing and has been noticing increasing cholesterol values in the past 2 to 3 years.  Patient was recently diagnosed as prediabetic with A1c of 5.7.  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 Adhering to healthy diet: Patient does have follow vegan diet and has started an omega 3 fish oil for her elevated cholesterol.  Regular exercise regimen: Yes, active lifestyle    Wt Readings from Last 3 Encounters:  01/28/24 179 lb (81.2 kg)  12/10/23 183 lb (83 kg)  11/06/23 180 lb 3.2 oz (81.7 kg)    CHEST PAIN:  Patient reports recurring exertional chest pain with shortness of breath and radiation of pain to left arm occurring more frequently. Patient states she has noticed increase in shortness of breath with mild activities as well  such as bending over and walking up the stairs. Patient is a former smoker who quit in 2015. She reports family hx of elevated cholesterol, CAD in father, and Type 2 DM. Patient recently diagnosed as prediabetic and had elevated total and LDL cholesterol levels for the past 3 years.  Patient has made dietary changes.  She denies nausea, vomiting, diaphoresis or dizziness at time of onset of pain.  She reports an episode approximately 2 years ago in which she had a syncopal episode while working out.  She followed up with cardiology on 09/24/2022 with [facility] with Dr. Alvino Chapel.  Her EKG was unremarkable at that time and had reported improvement of  symptoms.  Patient was recommended to return to cardiology if symptoms worsened or returned.  No stress test, echocardiogram, or coronary artery CT was performed at that time.   The 10-year ASCVD risk score (Arnett DK, et al., 2019) is: 2.4%   Values used to calculate the score:     Age: 58 years     Sex: Female     Is Non-Hispanic African American: No     Diabetic: No     Tobacco smoker: No     Systolic Blood Pressure: 117 mmHg     Is BP treated: No     HDL Cholesterol: 54 mg/dL     Total Cholesterol: 234 mg/dL    HEALTH SCREENINGS: - Vision Screening: up to date - Dental Visits: up to date - Pap smear: not applicable - Breast Exam: up to date - STD Screening: Declined - Mammogram (40+): Up to date  - Colonoscopy (45+): Up to date  - Bone Density (65+ or under 65 with predisposing conditions): Not applicable  - Lung CA screening with low-dose CT:  Not applicable Adults age 49-80 who are current cigarette smokers or quit within the last 15 years. Must have 20 pack year history.   Depression and Anxiety Screen done today and results listed below:     01/28/2024    9:31 AM 11/06/2023    9:09 AM 07/29/2019   11:08 AM  Depression screen PHQ 2/9  Decreased Interest 0 0 0  Down, Depressed, Hopeless 0 0 0  PHQ - 2 Score 0 0 0  Altered sleeping 0 0 0  Tired, decreased energy 0 0 0  Change in appetite 0 0 0  Feeling bad or failure about yourself  0 0 0  Trouble concentrating 0 0 0  Moving slowly or fidgety/restless 0 0 0  Suicidal thoughts 0 0 0  PHQ-9 Score 0 0 0  Difficult doing work/chores Not difficult at all Not difficult at all Not difficult at all      01/28/2024    9:31 AM 11/06/2023    9:09 AM 07/29/2019   11:08 AM  GAD 7 : Generalized Anxiety Score  Nervous, Anxious, on Edge 0 0 0  Control/stop worrying 0 0 0  Worry too much - different things 0 0 0  Trouble relaxing 0 0 0  Restless 0 0 0  Easily annoyed or irritable 0 0 0  Afraid - awful might happen 0 0 0   Total GAD 7 Score 0 0 0  Anxiety Difficulty Not difficult at all Not difficult at all Not difficult at all    IMMUNIZATIONS: - Tdap: Tetanus vaccination status reviewed: last tetanus booster within 10 years. - HPV: Not applicable - Influenza: Postponed to flu season - Pneumovax: Not applicable - Prevnar 20: Not applicable - Shingrix (50+): Up to date   Past medical history, surgical history, medications, allergies, family history and social history reviewed with patient today and changes made to appropriate areas of the chart.   Past Medical History:  Diagnosis Date   Anemia    Arthritis    Cataract 2019   Family history of bladder cancer    Family history of breast cancer    Family history of kidney cancer    Family history of leukemia    Family history of skin cancer    Headache(784.0)    Heart murmur     Past Surgical History:  Procedure Laterality Date   ABDOMINAL HYSTERECTOMY N/A 05/06/2013   Procedure: TOTAL ABDOMINAL HYSTERECTOMY;  Surgeon: Piedad Brewer, MD;  Location: [facility];  Service: Gynecology;  Laterality: N/A;   COLON RESECTION  1997   COLON SURGERY  1996   LYSIS OF ADHESION N/A 05/06/2013   Procedure: LYSIS OF ADHESION;  Surgeon: Piedad Brewer, MD;  Location: [facility];  Service: Gynecology;  Laterality: N/A;   MOUTH SURGERY     TOTAL ABDOMINAL HYSTERECTOMY      Current Outpatient Medications on File Prior to Visit  Medication Sig   betamethasone dipropionate 0.05 % cream Apply a thin layer to affected area 2 times a day as needed for itching   estradiol (ESTRACE) 0.1 MG/GM vaginal cream Place 1 gram vaginally 2 (two) times a week as directed.   estradiol (VIVELLE-DOT) 0.0375 MG/24HR Place 1 patch onto the skin 2 (two) times a week.   ipratropium (ATROVENT) 0.06 % nasal spray Place 1 spray into both nostrils 2 (two) times daily.   Testosterone 20 % CREA    triamcinolone ointment (KENALOG) 0.5 % Apply 1 Application topically 2 (two) times daily.    No current facility-administered medications on file prior to visit.    No Known Allergies   Social History   Socioeconomic History   Marital status: Married    Spouse name: Not on file   Number of children: Not on file   Years of education: Not on file   Highest education level: Bachelor's degree (e.g., BA, AB, BS)  Occupational  History   Not on file  Tobacco Use   Smoking status: Former    Current packs/day: 0.00    Types: Cigarettes    Quit date: 04/22/2008    Years since quitting: 15.7    Passive exposure: Past   Smokeless tobacco: Never  Vaping Use   Vaping status: Never Used  Substance and Sexual Activity   Alcohol use: No   Drug use: No   Sexual activity: Not Currently    Partners: Male    Birth control/protection: Surgical  Other Topics Concern   Not on file  Social History Narrative   Not on file   Social Drivers of Health   Financial Resource Strain: Low Risk  (11/04/2023)   Overall Financial Resource Strain (CARDIA)    Difficulty of Paying Living Expenses: Not very hard  Food Insecurity: No Food Insecurity (11/04/2023)   Hunger Vital Sign    Worried About Running Out of Food in the Last Year: Never true    Ran Out of Food in the Last Year: Never true  Transportation Needs: No Transportation Needs (11/04/2023)   PRAPARE - Administrator, Civil Service (Medical): No    Lack of Transportation (Non-Medical): No  Physical Activity: Insufficiently Active (11/04/2023)   Exercise Vital Sign    Days of Exercise per Week: 3 days    Minutes of Exercise per Session: 40 min  Stress: Stress Concern Present (11/04/2023)   [employer] of Occupational Health - Occupational Stress Questionnaire    Feeling of Stress : To some extent  Social Connections: Moderately Integrated (11/04/2023)   Social Connection and Isolation Panel [NHANES]    Frequency of Communication with Friends and Family: More than three times a week    Frequency of Social Gatherings  with Friends and Family: Once a week    Attends Religious Services: Never    Database administrator or Organizations: Yes    Attends Engineer, structural: More than 4 times per year    Marital Status: Married  Catering manager Violence: Not on file   Social History   Tobacco Use  Smoking Status Former   Current packs/day: 0.00   Types: Cigarettes   Quit date: 04/22/2008   Years since quitting: 15.7   Passive exposure: Past  Smokeless Tobacco Never   Social History   Substance and Sexual Activity  Alcohol Use No    Family History  Problem Relation Age of Onset   Breast cancer Mother 83   Arthritis Mother    Bladder Cancer Father 23   Kidney cancer Father        dx 59's, thinks it was a second primary rather than met   Breast cancer Sister        dx 19's.    Leukemia Maternal Grandfather    Skin cancer Maternal Grandfather        'a couple removed in his 70's'   Heart disease Paternal Grandmother    Breast cancer Other    Breast cancer Other      ROS: Denies fever, fatigue, unexplained weight loss/gain, and palpitations. Denies neurological deficits, gastrointestinal or genitourinary complaints, and skin changes.   Objective:   Today's Vitals   01/28/24 0927  BP: 117/71  Pulse: 75  SpO2: 99%  Weight: 179 lb (81.2 kg)  Height: 5' 6.5" (1.689 m)    GENERAL APPEARANCE: Well-appearing, in NAD. Well nourished.  SKIN: Pink, warm and dry. Turgor normal. No rash, lesion, ulceration, or ecchymoses.  Hair evenly distributed.  HEENT: HEAD: Normocephalic.  EYES: PERRLA. EOMI. Lids intact w/o defect. Sclera white, Conjunctiva pink w/o exudate.  EARS: External ear w/o redness, swelling, masses or lesions. EAC clear. TM's intact, translucent w/o bulging, appropriate landmarks visualized. Appropriate acuity to conversational tones.  NOSE: Septum midline w/o deformity. Nares patent, mucosa pink and non-inflamed w/o drainage. No sinus tenderness.  THROAT: Uvula  midline. Oropharynx clear. Tonsils non-inflamed w/o exudate. Oral mucosa pink and moist.  NECK: Supple, Trachea midline. Full ROM w/o pain or tenderness. No lymphadenopathy. Thyroid non-tender w/o enlargement or palpable masses.  RESPIRATORY: Chest wall symmetrical w/o masses. Respirations even and non-labored. Breath sounds clear to auscultation bilaterally. No wheezes, rales, rhonchi, or crackles. CARDIAC: S1, S2 present, regular rate and rhythm. No gallops, murmurs, rubs, or clicks. PMI w/o lifts, heaves, or thrills. No carotid bruits. Capillary refill <2 seconds. Peripheral pulses 2+ bilaterally. GI: Abdomen soft w/o distention. Normoactive bowel sounds. No palpable masses or tenderness. No guarding or rebound tenderness. Liver and spleen w/o tenderness or enlargement. No CVA tenderness.  MSK: Muscle tone and strength appropriate for age, w/o atrophy or abnormal movement.  EXTREMITIES: Active ROM intact, w/o tenderness, crepitus, or contracture. No obvious joint deformities or effusions. No clubbing, edema, or cyanosis.  NEUROLOGIC: CN's II-XII intact. Motor strength symmetrical with no obvious weakness. No sensory deficits. DTR's 2+ symmetric bilaterally. Steady, even gait.  PSYCH/MENTAL STATUS: Alert, oriented x 3. Cooperative, appropriate mood and affect.   EKG: 01/28/2024 10:08 AM Vent rate 79 bpm PR interval 138 MS QRS duration 88 MS QT/QTc 372/426 MS P-RR-T axes 13 68 51.  Normal sinus rhythm normal EKG. No significant change found when compared to EKG of 09/25/2022  Results for orders placed or performed in visit on 01/28/24  [facility] HEPATITIS C SCREENING LAB   Collection Time: 10/25/21 12:00 AM  Result Value Ref Range   HM Hepatitis Screen Negative-Validated     Assessment & Plan:  1. Annual physical exam (Primary) Discussed preventative screenings, vaccinations and healthy lifestyle with patient.  Reviewed patient's recent lab work.  2. Overweight (BMI 25.0-29.9) We discussed  dietary changes, regular exercise, caloric deficits and patient desires to speak with nutrition and medical weight management provider for guidance.  Referral placed. - Amb Ref to Medical Weight Management  3. Prediabetes Patient likely to benefit with medical weight management for dietary changes, regular exercise routine for control of prediabetes.  Will return in approximately 6 months for recheck. - Amb Ref to Medical Weight Management - Basic Metabolic Panel (BMET); Future - Hemoglobin A1c; Future  4. Elevated cholesterol Discussed good dietary changes, lifestyle, omega-3 diet and recommend repeat in 6 months.  Not a candidate for statin therapy at this time - Lipid panel; Future  5. Angina pectoris (HCC) Uncontrolled new problem.  Discussed possible causes of chest pain, given nature of exertional chest pain and frequently occurring episodes, I recommend further evaluation by cardiology.  Patient provided nitroglycerin tablet prescription to use as needed and safe use of this medication reviewed with her.  EKG was unremarkable.  Will obtain troponin T for lab work today.  Patient understanding to seek ER if pain occurs or worsens.  - Ambulatory referral to Cardiology - nitroGLYCERIN (NITROSTAT) 0.4 MG SL tablet; Place 1 tablet (0.4 mg total) under the tongue every 5 (five) minutes as needed for chest pain. max 3 tablets in 15 minutes. if no relief call 911  Dispense: 50 tablet; Refill: 3 - Troponin T - EKG 12-Lead  Orders Placed This Encounter  Procedures   Lipid panel    Standing Status:   Future    Expected Date:   07/29/2024    Expiration Date:   08/13/2024   Basic Metabolic Panel (BMET)    Standing Status:   Future    Expected Date:   07/29/2024    Expiration Date:   08/13/2024   Hemoglobin A1c    Standing Status:   Future    Expected Date:   07/29/2024    Expiration Date:   08/13/2024   [facility] HEPATITIS C SCREENING LAB    This external order was created through the  Results Console.   Troponin T   Amb Ref to Medical Weight Management    Referral Priority:   Routine    Referral Type:   Consultation    Number of Visits Requested:   1   Ambulatory referral to Cardiology    Referral Priority:   Urgent    Referral Type:   Consultation    Referral Reason:   Specialty Services Required    Number of Visits Requested:   1   EKG 12-Lead    PATIENT COUNSELING:  - Encouraged a healthy well-balanced diet. Patient may adjust caloric intake to maintain or achieve ideal body weight. May reduce intake of dietary saturated fat and total fat and have adequate dietary potassium and calcium preferably from fresh fruits, vegetables, and low-fat dairy products.   - Advised to avoid cigarette smoking. - Discussed with the patient that most people either abstain from alcohol or drink within safe limits (<=14/week and <=4 drinks/occasion for males, <=7/weeks and <= 3 drinks/occasion for females) and that the risk for alcohol disorders and other health effects rises proportionally with the number of drinks per week and how often a drinker exceeds daily limits. - Discussed cessation/primary prevention of drug use and availability of treatment for abuse.  - Discussed sexually transmitted diseases, avoidance of unintended pregnancy and contraceptive alternatives.  - Stressed the importance of regular exercise - Injury prevention: Discussed safety belts, safety helmets, smoke detector, smoking near bedding or upholstery.  - Dental health: Discussed importance of regular tooth brushing, flossing, and dental visits.   NEXT PREVENTATIVE PHYSICAL DUE IN 1 YEAR.  Return in about 6 months (around 07/29/2024) for Follow up HLD and Prediabetes (fasting labs prior) .  Patient to reach out to office if new, worrisome, or unresolved symptoms arise or if no improvement in patient's condition. Patient verbalized understanding and is agreeable to treatment plan. All questions answered to  patient's satisfaction.    Nonda Bays, [place]

## 2024-01-28 NOTE — Patient Instructions (Addendum)
 [email or web address] Hello Lingo CGM

## 2024-02-09 ENCOUNTER — Other Ambulatory Visit (HOSPITAL_COMMUNITY): Payer: Self-pay

## 2024-02-23 ENCOUNTER — Other Ambulatory Visit (HOSPITAL_COMMUNITY): Payer: Self-pay

## 2024-02-23 ENCOUNTER — Other Ambulatory Visit (HOSPITAL_BASED_OUTPATIENT_CLINIC_OR_DEPARTMENT_OTHER): Payer: Self-pay | Admitting: Family Medicine

## 2024-02-23 MED ORDER — IPRATROPIUM BROMIDE 0.06 % NA SOLN
1.0000 | Freq: Two times a day (BID) | NASAL | 1 refills | Status: DC
  Filled 2024-02-23: qty 15, 41d supply, fill #0
  Filled 2024-04-05: qty 15, 41d supply, fill #1

## 2024-02-24 ENCOUNTER — Other Ambulatory Visit (HOSPITAL_COMMUNITY): Payer: Self-pay

## 2024-02-24 ENCOUNTER — Other Ambulatory Visit: Payer: Self-pay

## 2024-03-29 ENCOUNTER — Other Ambulatory Visit (HOSPITAL_COMMUNITY): Payer: Self-pay

## 2024-03-31 ENCOUNTER — Other Ambulatory Visit (HOSPITAL_COMMUNITY): Payer: Self-pay

## 2024-05-01 ENCOUNTER — Other Ambulatory Visit (HOSPITAL_BASED_OUTPATIENT_CLINIC_OR_DEPARTMENT_OTHER): Payer: Self-pay | Admitting: Family Medicine

## 2024-05-03 ENCOUNTER — Other Ambulatory Visit (HOSPITAL_COMMUNITY): Payer: Self-pay

## 2024-05-03 MED ORDER — IPRATROPIUM BROMIDE 0.06 % NA SOLN
1.0000 | Freq: Two times a day (BID) | NASAL | 1 refills | Status: DC
  Filled 2024-05-03 – 2024-05-07 (×2): qty 15, 30d supply, fill #0
  Filled 2024-06-18: qty 15, 30d supply, fill #1

## 2024-05-07 ENCOUNTER — Other Ambulatory Visit (HOSPITAL_COMMUNITY): Payer: Self-pay

## 2024-05-13 ENCOUNTER — Ambulatory Visit: Admitting: Dermatology

## 2024-05-13 ENCOUNTER — Encounter: Payer: Self-pay | Admitting: Dermatology

## 2024-05-13 ENCOUNTER — Other Ambulatory Visit (HOSPITAL_COMMUNITY): Payer: Self-pay

## 2024-05-13 DIAGNOSIS — D229 Melanocytic nevi, unspecified: Secondary | ICD-10-CM

## 2024-05-13 DIAGNOSIS — Z1283 Encounter for screening for malignant neoplasm of skin: Secondary | ICD-10-CM

## 2024-05-13 DIAGNOSIS — L309 Dermatitis, unspecified: Secondary | ICD-10-CM | POA: Diagnosis not present

## 2024-05-13 DIAGNOSIS — D1801 Hemangioma of skin and subcutaneous tissue: Secondary | ICD-10-CM

## 2024-05-13 DIAGNOSIS — L308 Other specified dermatitis: Secondary | ICD-10-CM

## 2024-05-13 DIAGNOSIS — L578 Other skin changes due to chronic exposure to nonionizing radiation: Secondary | ICD-10-CM | POA: Diagnosis not present

## 2024-05-13 DIAGNOSIS — R21 Rash and other nonspecific skin eruption: Secondary | ICD-10-CM | POA: Diagnosis not present

## 2024-05-13 DIAGNOSIS — L814 Other melanin hyperpigmentation: Secondary | ICD-10-CM | POA: Diagnosis not present

## 2024-05-13 DIAGNOSIS — L821 Other seborrheic keratosis: Secondary | ICD-10-CM | POA: Diagnosis not present

## 2024-05-13 DIAGNOSIS — W908XXA Exposure to other nonionizing radiation, initial encounter: Secondary | ICD-10-CM | POA: Diagnosis not present

## 2024-05-13 MED ORDER — CLOBETASOL PROPIONATE 0.05 % EX OINT
1.0000 | TOPICAL_OINTMENT | Freq: Two times a day (BID) | CUTANEOUS | 5 refills | Status: DC
Start: 2024-05-13 — End: 2024-06-28
  Filled 2024-05-13: qty 30, 30d supply, fill #0

## 2024-05-13 NOTE — Progress Notes (Unsigned)
 New Patient Visit   Subjective  Denise Smith is a 58 y.o. female who presents for the following: Skin Cancer Screening and Full Body Skin Exam  There is a red rash on the posterior legs and her arms that itch. Patient was prescribed Triamcinolone  on her arms with no improvement. She also tried betamethasone  for 7 days with no changes.  In 2021 Patient had a blistering rash on her abdomen and under her breasts. It was biopsied and had allergy testing done that came back negative. She states that it faded after about 1-1.5 years.  She uses Cerave lotion and mild detergent.  The patient presents for Total-Body Skin Exam (TBSE) for skin cancer screening and mole check. The patient has spots, moles and lesions to be evaluated, some may be new or changing.  The following portions of the chart were reviewed this encounter and updated as appropriate: medications, allergies, medical history  Review of Systems:  No other skin or systemic complaints except as noted in HPI or Assessment and Plan.  Objective  Well appearing patient in no apparent distress; mood and affect are within normal limits.  A full examination was performed including scalp, head, eyes, ears, nose, lips, neck, chest, axillae, abdomen, back, buttocks, bilateral upper extremities, bilateral lower extremities, hands, feet, fingers, toes, fingernails, and toenails. All findings within normal limits unless otherwise noted below.   Relevant physical exam findings are noted in the Assessment and Plan.    Assessment & Plan   SKIN CANCER SCREENING PERFORMED TODAY.  ACTINIC DAMAGE - Chronic condition, secondary to cumulative UV/sun exposure - diffuse scaly erythematous macules with underlying dyspigmentation - Recommend daily broad spectrum sunscreen SPF 30+ to sun-exposed areas, reapply every 2 hours as needed.  - Staying in the shade or wearing long sleeves, sun glasses (UVA+UVB protection) and wide brim hats (4-inch  brim around the entire circumference of the hat) are also recommended for sun protection.  - Call for new or changing lesions.  LENTIGINES, SEBORRHEIC KERATOSES, HEMANGIOMAS - Benign normal skin lesions - Benign-appearing - Call for any changes  MELANOCYTIC NEVI - Tan-brown and/or pink-flesh-colored symmetric macules and papules - Benign appearing on exam today - Observation - Call clinic for new or changing moles - Recommend daily use of broad spectrum spf 30+ sunscreen to sun-exposed areas.        RASH AND OTHER NONSPECIFIC SKIN ERUPTION Left Forearm - Posterior, Left Thigh - Anterior Skin / nail biopsy - Left Forearm - Posterior, Left Thigh - Anterior Type of biopsy: punch   Informed consent: discussed and consent obtained   Timeout: patient name, date of birth, surgical site, and procedure verified   Punch size:  4 mm Hemostasis achieved with: Gelfoam and electrodesiccation   Outcome: patient tolerated procedure well   Post-procedure details: sterile dressing applied and wound care instructions given   Dressing type: petrolatum and pressure dressing    Specimen 2 - Surgical pathology Differential Diagnosis: r/o contact dermatitis vs ID reaction vs MF vs. Nummular eczema vs drug vs other   Check Margins: No  Specimen 1 - Surgical pathology Differential Diagnosis: r/o contact dermatitis vs ID reaction vs MF vs. Nummular eczema vs drug vs other  Check Margins: No  No follow-ups on file.  LILLETTE Rollene Gobble, RN, am acting as scribe for RUFUS CHRISTELLA HOLY, MD .   Documentation: I have reviewed the above documentation for accuracy and completeness, and I agree with the above.  RUFUS CHRISTELLA HOLY, MD

## 2024-05-18 ENCOUNTER — Ambulatory Visit: Payer: Self-pay | Admitting: Dermatology

## 2024-05-18 DIAGNOSIS — H40013 Open angle with borderline findings, low risk, bilateral: Secondary | ICD-10-CM | POA: Diagnosis not present

## 2024-05-18 DIAGNOSIS — H5201 Hypermetropia, right eye: Secondary | ICD-10-CM | POA: Diagnosis not present

## 2024-05-18 LAB — SURGICAL PATHOLOGY

## 2024-05-31 ENCOUNTER — Encounter: Payer: Self-pay | Admitting: *Deleted

## 2024-06-03 ENCOUNTER — Telehealth: Payer: Self-pay | Admitting: Internal Medicine

## 2024-06-03 ENCOUNTER — Ambulatory Visit: Attending: Internal Medicine | Admitting: Internal Medicine

## 2024-06-03 ENCOUNTER — Encounter: Payer: Self-pay | Admitting: Internal Medicine

## 2024-06-03 ENCOUNTER — Other Ambulatory Visit (HOSPITAL_COMMUNITY): Payer: Self-pay

## 2024-06-03 VITALS — BP 110/66 | HR 74 | Ht 66.0 in | Wt 180.0 lb

## 2024-06-03 DIAGNOSIS — R079 Chest pain, unspecified: Secondary | ICD-10-CM

## 2024-06-03 DIAGNOSIS — I2089 Other forms of angina pectoris: Secondary | ICD-10-CM

## 2024-06-03 DIAGNOSIS — R42 Dizziness and giddiness: Secondary | ICD-10-CM | POA: Diagnosis not present

## 2024-06-03 DIAGNOSIS — Z79890 Hormone replacement therapy (postmenopausal): Secondary | ICD-10-CM

## 2024-06-03 DIAGNOSIS — R110 Nausea: Secondary | ICD-10-CM | POA: Diagnosis not present

## 2024-06-03 DIAGNOSIS — R7303 Prediabetes: Secondary | ICD-10-CM | POA: Diagnosis not present

## 2024-06-03 DIAGNOSIS — E785 Hyperlipidemia, unspecified: Secondary | ICD-10-CM

## 2024-06-03 DIAGNOSIS — Z8249 Family history of ischemic heart disease and other diseases of the circulatory system: Secondary | ICD-10-CM

## 2024-06-03 MED ORDER — METOPROLOL TARTRATE 100 MG PO TABS
100.0000 mg | ORAL_TABLET | Freq: Once | ORAL | 0 refills | Status: DC
  Filled 2024-06-03 – 2024-06-18 (×2): qty 1, 1d supply, fill #0

## 2024-06-03 NOTE — Telephone Encounter (Signed)
 Patient will be calling us  to schedule her Carotid and Echo once she has her work schedule with her. Let Buel Ege -RN know 06/03/2024 2:40pm.

## 2024-06-03 NOTE — Patient Instructions (Signed)
 Medication Instructions:  Take Metoprolol  Tartrate 100 mg , one tablet, once, 2 hours prior to your Cardiac CT  *If you need a refill on your cardiac medications before your next appointment, please call your pharmacy*  Lab Work: BMET; Non Fasting, today or next week, in preparation for the Cardiac CT to check kidney function/electrolytes  If you have labs (blood work) drawn today and your tests are completely normal, you will receive your results only by: MyChart Message (if you have MyChart) OR A paper copy in the mail If you have any lab test that is abnormal or we need to change your treatment, we will call you to review the results.  Testing/Procedures: Your physician has requested that you have an echocardiogram. Echocardiography is a painless test that uses sound waves to create images of your heart. It provides your doctor with information about the size and shape of your heart and how well your heart's chambers and valves are working. This procedure takes approximately one hour. There are no restrictions for this procedure. Please do NOT wear cologne, perfume, aftershave, or lotions (deodorant is allowed). Please arrive 15 minutes prior to your appointment time.  Please note: We ask at that you not bring children with you during ultrasound (echo/ vascular) testing. Due to room size and safety concerns, children are not allowed in the ultrasound rooms during exams. Our front office staff cannot provide observation of children in our lobby area while testing is being conducted. An adult accompanying a patient to their appointment will only be allowed in the ultrasound room at the discretion of the ultrasound technician under special circumstances. We apologize for any inconvenience.   Your physician has requested that you have a carotid duplex. This test is an ultrasound of the carotid arteries in your neck. It looks at blood flow through these arteries that supply the brain with blood.  Allow one hour for this exam. There are no restrictions or special instructions. This will take place at [address], 4th floor  Please note: We ask at that you not bring children with you during ultrasound (echo/ vascular) testing. Due to room size and safety concerns, children are not allowed in the ultrasound rooms during exams. Our front office staff cannot provide observation of children in our lobby area while testing is being conducted. An adult accompanying a patient to their appointment will only be allowed in the ultrasound room at the discretion of the ultrasound technician under special circumstances. We apologize for any inconvenience.   Your cardiac CT will be scheduled at one of the below locations:   [facility] [address] [place], [place] [address] [phone]  OR   [facility] [address]  [place], [place] [address]  If scheduled at [facility], please arrive at the [facility] and Children's Entrance (Entrance C2) of [facility] 30 minutes prior to test start time. You can use the FREE valet parking offered at entrance C (encouraged to control the heart rate for the test)  Proceed to the [facility] Radiology Department (first floor) to check-in and test prep.  All radiology patients and guests should use entrance C2 at [facility], accessed from [facility], even though the hospital's physical address listed is [address]  If scheduled at the Heart and Vascular Tower at [address], please enter the parking lot using the [address] entrance and use the FREE valet service at the  patient drop-off area. Enter the building and check-in with registration on the main floor.  Please follow these instructions carefully (unless otherwise directed):  An IV will be required for this test and Nitroglycerin  will be given.  Hold all erectile dysfunction medications at least 3  days (72 hrs) prior to test. (Ie viagra, cialis, sildenafil, tadalafil, etc)   On the Night Before the Test: Be sure to Drink plenty of water. Do not consume any caffeinated/decaffeinated beverages or chocolate 12 hours prior to your test. Do not take any antihistamines 12 hours prior to your test.  On the Day of the Test: Drink plenty of water until 1 hour prior to the test. Do not eat any food 1 hour prior to test. You may take your regular medications prior to the test.  Take metoprolol  (Lopressor ) 100 mg two hours prior to test. If you take Furosemide/Hydrochlorothiazide/Spironolactone/Chlorthalidone, please HOLD on the morning of the test. Patients who wear a continuous glucose monitor MUST remove the device prior to scanning. FEMALES- please wear underwire-free bra if available, avoid dresses & tight clothing      After the Test: Drink plenty of water. After receiving IV contrast, you may experience a mild flushed feeling. This is normal. On occasion, you may experience a mild rash up to 24 hours after the test. This is not dangerous. If this occurs, you can take Benadryl  25 mg, Zyrtec, Claritin, or Allegra and increase your fluid intake. (Patients taking Tikosyn should avoid Benadryl , and may take Zyrtec, Claritin, or Allegra) If you experience trouble breathing, this can be serious. If it is severe call 911 IMMEDIATELY. If it is mild, please call our office.  We will call to schedule your test 2-4 weeks out understanding that some insurance companies will need an authorization prior to the service being performed.   For more information and frequently asked questions, please visit our website : [email or web address]  For non-scheduling related questions, please contact the cardiac imaging nurse navigator should you have any questions/concerns: Cardiac Imaging Nurse Navigators Direct Office Dial: [phone]   For scheduling needs, including cancellations and  rescheduling, please call [place], [phone].   Follow-Up: At [facility], you and your health needs are our priority.  As part of our continuing mission to provide you with exceptional heart care, our providers are all part of one team.  This team includes your primary Cardiologist (physician) and Advanced Practice Providers or APPs (Physician Assistants and Nurse Practitioners) who all work together to provide you with the care you need, when you need it.  Your next appointment:    6-8 weeks  Provider:   Gayatri A Acharya, MD

## 2024-06-03 NOTE — Progress Notes (Unsigned)
 Cardiology Office Note:  .   Date:  06/03/2024  ID:  Dara Louise Reich, DOB 1966/02/19, MRN [id number] PCP: Knute Thersia Bitters, FNP  [facility] Providers Cardiologist:  None    History of Present Illness: .   Denise Smith is a 58 y.o. female.  Discussed the use of AI scribe software for clinical note transcription with the patient, who gave verbal consent to proceed.  History of Present Illness Denise Smith is a 58 year old female who presents with chest pain and high cholesterol. She was referred by Thersia Macintosh, NP, for evaluation of chest pain and high cholesterol.  She experiences intermittent chest pain for several years, often occurring in the morning or during workouts. The pain is described as a 'pinch' located in the middle to left side of her chest, sometimes occurring without any apparent reason. It is relieved by stopping activity and resting. She recalls an incident at the gym where she almost passed out, felt nauseous, and experienced the room spinning while on the [place].  She is post-menopausal and recently noted an acute rise in her cholesterol and triglycerides, which were previously normal. Her LDL cholesterol was recorded at 165 mg/dL, and her triglycerides were 93 mg/dL as of February. She follows a vegan diet and is concerned about her weight and cholesterol levels. She also mentioned being prediabetic with a higher than usual A1c.  Her family history is significant for her father having severe carotid artery disease, requiring bypass surgery, and experiencing a TIA. He also developed atrial fibrillation and had uncontrolled hypertension.  No history of asthma, palpitations, or fainting, although she recalls fainting once when she was younger. She experiences dizziness and nausea during exercise, particularly when her heart rate increases, and she sometimes feels lightheaded without nausea. She attempts to hydrate with water, Himalayan  pink salt, and lemon but acknowledges she could improve her hydration habits.  She has not undergone any cardiovascular testing beyond an EKG, which has always been normal. She is currently using hormone replacement therapy for menopausal symptoms, which she reports is effective.    ROS: negative except per HPI above.  Studies Reviewed: .        Results LABS LDL: 136 (2023) LDL: 111 (7975) LDL: 164 (97/7974) Triglycerides: 93 (11/2023)  DIAGNOSTIC EKG: Normal Risk Assessment/Calculations:   {Does this patient have ATRIAL FIBRILLATION?:[id number]}   Physical Exam:   VS:  BP 110/66   Pulse 74   Ht 5' 6 (1.676 m)   Wt 180 lb (81.6 kg)   LMP 04/21/2013   SpO2 97%   BMI 29.05 kg/m    Wt Readings from Last 3 Encounters:  06/03/24 180 lb (81.6 kg)  01/28/24 179 lb (81.2 kg)  12/10/23 183 lb (83 kg)     Physical Exam GENERAL: Alert, cooperative, well developed, no acute distress. HEENT: Normocephalic, normal oropharynx, moist mucous membranes. CHEST: Clear to auscultation bilaterally, no wheezes, rhonchi, or crackles. CARDIOVASCULAR: Normal heart rate and rhythm, S1 and S2 normal without murmurs, no carotid bruits. ABDOMEN: Soft, non-tender, non-distended, without organomegaly, normal bowel sounds. EXTREMITIES: No cyanosis or edema. NEUROLOGICAL: Cranial nerves grossly intact, moves all extremities without gross motor or sensory deficit.   ASSESSMENT AND PLAN: .    Assessment and Plan Assessment & Plan Exertional chest pain and dizziness Intermittent exertional chest pain and dizziness, often during physical activity, with symptoms of nausea, lightheadedness, and occasional near-syncope, resolving with rest. Differential includes cardiac causes such as coronary artery disease and  non-cardiac causes like musculoskeletal or nerve-related issues. Family history of significant cardiovascular disease, including carotid artery disease and atrial fibrillation in her father.  Normal EKG. No history of asthma or palpitations. No prior cardiovascular testing beyond EKG. - Order coronary CT to assess for coronary artery disease and plaque burden. - Order echocardiogram to evaluate for structural heart issues contributing to exertional symptoms. - Order carotid ultrasound to assess for carotid artery disease, given family history.  Hyperlipidemia Recent acute rise in cholesterol levels, with LDL at 164 mg/dL. Triglycerides are within normal range. Postmenopausal state and family history of carotid artery disease may contribute to lipid profile. Discussed potential need for medication therapy if coronary CT reveals plaque or calcium. Emphasized lifestyle modifications as a primary approach if no significant disease is found. - Order coronary CT to assess for plaque and calcium, which will guide treatment decisions. - Discuss potential initiation of statin therapy if significant plaque is found. - Encourage lifestyle modifications, including dietary changes and increased physical activity, especially if coronary CT is normal.  Prediabetes Elevated A1c indicating prediabetes.  Postmenopausal state on hormone replacement therapy Postmenopausal state managed with hormone replacement therapy, which is effective for symptom control. Discussed potential impact of hormone therapy on cholesterol levels, though topical application is less likely to significantly affect lipid profile. - Continue current hormone replacement therapy as it is effective for symptom management.      Soyla Merck, MD, FACC

## 2024-06-04 ENCOUNTER — Ambulatory Visit: Payer: Self-pay | Admitting: Internal Medicine

## 2024-06-04 LAB — BASIC METABOLIC PANEL WITH GFR
BUN/Creatinine Ratio: 15 (ref 9–23)
BUN: 11 mg/dL (ref 6–24)
CO2: 22 mmol/L (ref 20–29)
Calcium: 9.5 mg/dL (ref 8.7–10.2)
Chloride: 101 mmol/L (ref 96–106)
Creatinine, Ser: 0.75 mg/dL (ref 0.57–1.00)
Glucose: 82 mg/dL (ref 70–99)
Potassium: 4.2 mmol/L (ref 3.5–5.2)
Sodium: 139 mmol/L (ref 134–144)
eGFR: 93 mL/min/1.73 (ref >59)

## 2024-06-10 ENCOUNTER — Ambulatory Visit (HOSPITAL_COMMUNITY)
Admission: RE | Admit: 2024-06-10 | Discharge: 2024-06-10 | Disposition: A | Discharge status: Home / Self Care | Source: Ambulatory Visit | Attending: Internal Medicine | Admitting: Internal Medicine

## 2024-06-10 DIAGNOSIS — Z87891 Personal history of nicotine dependence: Secondary | ICD-10-CM | POA: Diagnosis not present

## 2024-06-10 DIAGNOSIS — R55 Syncope and collapse: Secondary | ICD-10-CM | POA: Diagnosis not present

## 2024-06-10 DIAGNOSIS — E785 Hyperlipidemia, unspecified: Secondary | ICD-10-CM | POA: Diagnosis not present

## 2024-06-10 DIAGNOSIS — Z8249 Family history of ischemic heart disease and other diseases of the circulatory system: Secondary | ICD-10-CM | POA: Diagnosis not present

## 2024-06-15 ENCOUNTER — Other Ambulatory Visit (HOSPITAL_COMMUNITY): Payer: Self-pay

## 2024-06-18 ENCOUNTER — Other Ambulatory Visit (HOSPITAL_COMMUNITY): Payer: Self-pay

## 2024-06-18 ENCOUNTER — Encounter (HOSPITAL_BASED_OUTPATIENT_CLINIC_OR_DEPARTMENT_OTHER): Payer: Self-pay | Admitting: Family Medicine

## 2024-06-18 ENCOUNTER — Other Ambulatory Visit: Payer: Self-pay

## 2024-06-22 ENCOUNTER — Ambulatory Visit (INDEPENDENT_AMBULATORY_CARE_PROVIDER_SITE_OTHER): Admitting: Student

## 2024-06-22 ENCOUNTER — Ambulatory Visit (HOSPITAL_BASED_OUTPATIENT_CLINIC_OR_DEPARTMENT_OTHER): Admitting: Student

## 2024-06-22 ENCOUNTER — Other Ambulatory Visit: Payer: Self-pay

## 2024-06-22 DIAGNOSIS — G8929 Other chronic pain: Secondary | ICD-10-CM

## 2024-06-22 DIAGNOSIS — M25561 Pain in right knee: Secondary | ICD-10-CM

## 2024-06-22 MED ORDER — TRIAMCINOLONE ACETONIDE 40 MG/ML IJ SUSP
2.0000 mL | INTRAMUSCULAR | Status: AC | PRN
  Administered 2024-06-22: 2 mL via INTRA_ARTICULAR

## 2024-06-22 MED ORDER — LIDOCAINE HCL 1 % IJ SOLN
4.0000 mL | INTRAMUSCULAR | Status: AC | PRN
  Administered 2024-06-22: 4 mL

## 2024-06-22 NOTE — Progress Notes (Signed)
 Chief Complaint: Right knee pain    Discussed the use of AI scribe software for clinical note transcription with the patient, who gave verbal consent to proceed.  History of Present Illness Denise Smith is a 58 year old female with a history of a medial meniscus tear who presents with right knee pain.  She experiences a 'clicky' sensation in the right knee with pain that worsens with activity and persists despite rest. The pain is located behind the knee, intensifying with flexion, especially during deep squats. Occasionally, the pain radiates down the front of the knee. There is no specific injury event recalled, but she has a history of knee issues. No significant swelling is observed, though she feels internal swelling. She manages symptoms with rest, ice, compression, elevation, and occasionally uses a knee brace. This knee had a medial meniscus tear treated with a scope procedure in 2009 or 2010. Frequent popping and clicking sensations occur in the knee. 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 Her new exercise regimen at [employer] includes sprints, twisting, and weight lifting, which may contribute to her symptoms.   Surgical History:   Right knee arthroscopy ~ 2009  PMH/PSH/Family History/Social History/Meds/Allergies:    Past Medical History:  Diagnosis Date   Anemia    Arthritis    Cataract 2019   Family history of bladder cancer    Family history of breast cancer    Family history of kidney cancer    Family history of leukemia    Family history of skin cancer    Headache(784.0)    Heart murmur    Past Surgical History:  Procedure Laterality Date   ABDOMINAL HYSTERECTOMY N/A 05/06/2013   Procedure: TOTAL ABDOMINAL HYSTERECTOMY;  Surgeon: Charlie CHRISTELLA Croak, MD;  Location: [facility];  Service: Gynecology;  Laterality: N/A;   COLON RESECTION  1997   COLON SURGERY   1996   LYSIS OF ADHESION N/A 05/06/2013   Procedure: LYSIS OF ADHESION;  Surgeon: Charlie CHRISTELLA Croak, MD;  Location: [facility];  Service: Gynecology;  Laterality: N/A;   MOUTH SURGERY     TOTAL ABDOMINAL HYSTERECTOMY     Social History   Socioeconomic History   Marital status: Married    Spouse name: Not on file   Number of children: Not on file   Years of education: Not on file   Highest education level: Bachelor's degree (e.g., BA, AB, BS)  Occupational History   Not on file  Tobacco Use   Smoking status: Former    Current packs/day: 0.00    Types: Cigarettes    Quit date: 04/22/2008    Years since quitting: 16.1    Passive exposure: Past   Smokeless tobacco: Never  Vaping Use   Vaping status: Never Used  Substance and Sexual Activity   Alcohol use: No   Drug use: No   Sexual activity: Not Currently    Partners: Male    Birth control/protection: Surgical  Other Topics Concern   Not on file  Social History Narrative   Not on file   Social Drivers of Health   Financial Resource Strain: Low Risk  (11/04/2023)   Overall Financial Resource Strain (CARDIA)    Difficulty of Paying Living Expenses: Not very hard  Food Insecurity: No  Food Insecurity (11/04/2023)   Hunger Vital Sign    Worried About Running Out of Food in the Last Year: Never true    Ran Out of Food in the Last Year: Never true  Transportation Needs: No Transportation Needs (11/04/2023)   PRAPARE - Administrator, Civil Service (Medical): No    Lack of Transportation (Non-Medical): No  Physical Activity: Insufficiently Active (11/04/2023)   Exercise Vital Sign    Days of Exercise per Week: 3 days    Minutes of Exercise per Session: 40 min  Stress: Stress Concern Present (11/04/2023)   [employer] of Occupational Health - Occupational Stress Questionnaire    Feeling of Stress : To some extent  Social Connections: Moderately Integrated (11/04/2023)   Social Connection and Isolation Panel     Frequency of Communication with Friends and Family: More than three times a week    Frequency of Social Gatherings with Friends and Family: Once a week    Attends Religious Services: Never    Database administrator or Organizations: Yes    Attends Engineer, structural: More than 4 times per year    Marital Status: Married   Family History  Problem Relation Age of Onset   Breast cancer Mother 45   Arthritis Mother    Skin cancer Mother    Bladder Cancer Father 30   Kidney cancer Father        dx 59's, thinks it was a second primary rather than met   Breast cancer Sister        dx 42's.    Leukemia Maternal Grandfather    Skin cancer Maternal Grandfather        'a couple removed in his 37's'   Heart disease Paternal Grandmother    Breast cancer Other    Breast cancer Other    No Known Allergies Current Outpatient Medications  Medication Sig Dispense Refill   betamethasone  dipropionate 0.05 % cream Apply a thin layer to affected area 2 times a day as needed for itching 60 g 0   clobetasol  ointment (TEMOVATE ) 0.05 % Apply 1 Application topically 2 (two) times daily. 30 g 5   estradiol  (ESTRACE ) 0.1 MG/GM vaginal cream Place 1 gram vaginally 2 (two) times a week as directed. 42.5 g 4   estradiol  (VIVELLE -DOT) 0.0375 MG/24HR Place 1 patch onto the skin 2 (two) times a week. 24 patch 3   ipratropium (ATROVENT ) 0.06 % nasal spray Place 1 spray into both nostrils 2 (two) times daily. 15 mL 1   metoprolol  tartrate (LOPRESSOR ) 100 MG tablet Take 1 tablet (100 mg total) by mouth once for 1 dose. Take 2 hours before your Cardiac CT 1 tablet 0   nitroGLYCERIN  (NITROSTAT ) 0.4 MG SL tablet Place 1 tablet (0.4 mg total) under the tongue every 5 (five) minutes as needed for chest pain. max 3 tablets in 15 minutes. if no relief call 911 50 tablet 3   Testosterone  20 % CREA      No current facility-administered medications for this visit.   No results found.  Review of Systems:   A ROS  was performed including pertinent positives and negatives as documented in the HPI.  Physical Exam :   Constitutional: NAD and appears stated age Neurological: Alert and oriented Psych: Appropriate affect and cooperative Last menstrual period 04/21/2013.   Comprehensive Musculoskeletal Exam:    Exam of the right knee demonstrates active range of motion from 0 to 120 degrees with  mild crepitus.  No significant effusion, erythema, or warmth present.  Tenderness along the medial joint line.  Knee flexion and extension strength is 5/5.  Stable collaterals with varus and valgus stress.  Imaging:   Xray (right knee 3 views): Tricompartmental degenerative changes appear mild to moderate within the medial and patellofemoral compartments with osteophyte formation.  No evidence of acute abnormality.   I personally reviewed and interpreted the radiographs.      Assessment & Plan Right knee osteoarthritis with pain   Chronic right knee pain persists following medial meniscus tear repair, worsened by squatting and sitting cross-legged. Imaging reveals mild to moderate medial and patellofemoral       compartment osteoarthritis, with suspected patellofemoral symptoms due to pain during knee flexion. A steroid injection was administered to the right knee to alleviate symptoms. Monitor for symptom improvement or recurrence. Should symptoms persist or recur, would likely consider MRI to further evaluate meniscal integrity.        Procedure Note  Patient: Denise Smith             Date of Birth: 1966/10/14           MRN: [id number]             Visit Date: 06/22/2024  Procedures: Visit Diagnoses:  1. Acute pain of right knee     Large Joint Inj: R knee on 06/22/2024 11:17 PM Indications: pain Details: 22 G 1.5 in needle, anterolateral approach Medications: 4 mL lidocaine  1 %; 2 mL triamcinolone  acetonide 40 MG/ML Outcome: tolerated well, no immediate complications Procedure, treatment  alternatives, risks and benefits explained, specific risks discussed. Consent was given by the patient. Immediately prior to procedure a time out was called to verify the correct patient, procedure, equipment, support staff and site/side marked as required. Patient was prepped and draped in the usual sterile fashion.       I personally saw and evaluated the patient, and participated in the management and treatment plan.  Leonce Reveal, PA-C Orthopedics

## 2024-06-24 ENCOUNTER — Encounter (HOSPITAL_COMMUNITY): Payer: Self-pay

## 2024-06-24 NOTE — Telephone Encounter (Signed)
 Patient has schedule appt  for carotid and echo for month of sept 2025.

## 2024-06-28 ENCOUNTER — Encounter: Payer: Self-pay | Admitting: Dermatology

## 2024-06-28 ENCOUNTER — Ambulatory Visit (HOSPITAL_COMMUNITY)

## 2024-06-28 ENCOUNTER — Ambulatory Visit (INDEPENDENT_AMBULATORY_CARE_PROVIDER_SITE_OTHER): Admitting: Dermatology

## 2024-06-28 DIAGNOSIS — B079 Viral wart, unspecified: Secondary | ICD-10-CM | POA: Diagnosis not present

## 2024-06-28 DIAGNOSIS — L309 Dermatitis, unspecified: Secondary | ICD-10-CM | POA: Diagnosis not present

## 2024-06-28 DIAGNOSIS — L308 Other specified dermatitis: Secondary | ICD-10-CM

## 2024-06-28 DIAGNOSIS — Z7189 Other specified counseling: Secondary | ICD-10-CM

## 2024-06-28 NOTE — Patient Instructions (Signed)

## 2024-06-28 NOTE — Progress Notes (Signed)
 Follow-Up Visit   Subjective  Denise Smith is a 58 y.o. female who presents for the following: here to follow up for bx proven eczema rash and talk about warts. She states that the rash is still present, albeit not as bad, Still itchy, bothersome.   The following portions of the chart were reviewed this encounter and updated as appropriate: medications, allergies, medical history  Review of Systems:  No other skin or systemic complaints except as noted in HPI or Assessment and Plan.  Objective  Well appearing patient in no apparent distress; mood and affect are within normal limits.   A focused examination was performed of the following areas: Left arm and thigh  Relevant exam findings are noted in the Assessment and Plan.  Left Forearm - Anterior (2) Verrucous papules   Assessment & Plan   ECZEMA-LIKE RASH- >10% BSA Exam: Scaly pink papules coalescing to plaques  Improved but not at goal, failed topical steroids and TCS  Atopic dermatitis (eczema) is a chronic, relapsing, pruritic condition that can significantly affect quality of life. It is often associated with allergic rhinitis and/or asthma and can require treatment with topical medications, phototherapy, or in severe cases biologic injectable medication (Dupixent ; Adbry) or Oral JAK inhibitors.  Treatment Plan: Patient stated it cleared up but said the clobetasol  stained everything.   Recommend gentle skin care.   Plan: Dupixent  Initiation Indications:  Patient isn't a candidate for systemic therapy with methotrexate or cyclosporine. Patient has been unresponsive to aggressive topical therapy.  Failed Treatments: Topical Steroids and Topical Protopic  Treatment Protocol: 600 mg [place] day 0 then 300 mg [place] every other week  Specific Contraindications Cyclosporine is contraindicated because the patient will not be able to complete the necessary follow-up labs. Methotrexate is contraindicated because the  patient will not be able to complete the necessary follow-up labs. Phototherapy is contraindicated because the patient lives too far from the treatment location.  Dupixent  Counseling: I discussed with the patient the risks of dupilumab  including but not limited to eye infection and irritation, cold sores, injection site reactions, worsening of asthma, allergic reactions and increased risk of parasitic infection. Live vaccines should be avoided while taking dupilumab . Dupilumab  will also interact with certain medications such as warfarin and cyclosporine. The patient understands that monitoring is required and they must alert us  or the primary physician if symptoms of infection or other concerning signs are noted.  Dupixent  Monitoring: There is no laboratory monitoring requirement with Dupixent .   WART Exam: verrucous papule(s) Right hand x 2  Counseling Discussed viral / HPV (Human Papilloma Virus) etiology and risk of spread /infectivity to other areas of body as well as to other people.  Multiple treatments and methods may be required to clear warts and it is possible treatment may not be successful.  Treatment risks include discoloration; scarring and there is still potential for wart recurrence.  Treatment Plan: Cryo    OTHER ECZEMA   VIRAL WARTS, UNSPECIFIED TYPE (2) Left Forearm - Anterior (2) Destruction of lesion - Left Forearm - Anterior (2) Complexity: simple   Destruction method: cryotherapy   Informed consent: discussed and consent obtained   Outcome: patient tolerated procedure well with no complications   Post-procedure details: wound care instructions given     Return in about 4 months (around 10/28/2024) for eczema follow up .  I, Berwyn Lesches, Surg Tech III, am acting as scribe for RUFUS CHRISTELLA HOLY, MD.   Documentation: I have reviewed the above  documentation for accuracy and completeness, and I agree with the above.  RUFUS CHRISTELLA HOLY, MD

## 2024-07-01 ENCOUNTER — Other Ambulatory Visit: Payer: Self-pay

## 2024-07-01 ENCOUNTER — Other Ambulatory Visit: Payer: Self-pay | Admitting: Dermatology

## 2024-07-01 ENCOUNTER — Other Ambulatory Visit (HOSPITAL_COMMUNITY): Payer: Self-pay

## 2024-07-01 MED ORDER — DUPIXENT 300 MG/2ML ~~LOC~~ SOAJ
300.0000 mg | SUBCUTANEOUS | 11 refills | Status: DC
  Filled 2024-07-01: qty 4, 28d supply, fill #0

## 2024-07-01 NOTE — Progress Notes (Signed)
 Pharmacy Patient Advocate Encounter  Insurance verification completed.   The patient is insured through [facility]   Ran test claim for Dupixent. Co-pay is $0. Patient has copay card.  This test claim was processed through [facility]- copay amounts may vary at other pharmacies due to pharmacy/plan contracts, or as the patient moves through the different stages of their insurance plan.

## 2024-07-01 NOTE — Progress Notes (Signed)
 Sending in RX discussed at last visit after getting it approved

## 2024-07-02 ENCOUNTER — Telehealth: Payer: Self-pay | Admitting: Pharmacist

## 2024-07-02 ENCOUNTER — Other Ambulatory Visit: Payer: Self-pay

## 2024-07-02 ENCOUNTER — Ambulatory Visit (HOSPITAL_COMMUNITY)
Admission: RE | Admit: 2024-07-02 | Discharge: 2024-07-02 | Disposition: A | Discharge status: Home / Self Care | Source: Ambulatory Visit | Attending: Internal Medicine | Admitting: Internal Medicine

## 2024-07-02 DIAGNOSIS — I2089 Other forms of angina pectoris: Secondary | ICD-10-CM | POA: Insufficient documentation

## 2024-07-02 LAB — ECHOCARDIOGRAM COMPLETE
Area-P 1/2: 3.54 cm2
S' Lateral: 2.6 cm

## 2024-07-02 NOTE — Telephone Encounter (Signed)
 Called patient to schedule an appointment for the [facility]. I was unable to reach the patient so I left a HIPAA-compliant message requesting that the patient return my call.   Herlene Fleeta Morris, PharmD, JAQUELINE, CPP Clinical Pharmacist [facility] & [facility] [phone]

## 2024-07-05 ENCOUNTER — Other Ambulatory Visit: Payer: Self-pay

## 2024-07-06 ENCOUNTER — Encounter: Payer: Self-pay | Admitting: Pharmacist

## 2024-07-06 ENCOUNTER — Other Ambulatory Visit: Payer: Self-pay

## 2024-07-06 ENCOUNTER — Ambulatory Visit: Attending: Family Medicine | Admitting: Pharmacist

## 2024-07-06 ENCOUNTER — Other Ambulatory Visit: Payer: Self-pay | Admitting: Pharmacist

## 2024-07-06 DIAGNOSIS — Z7189 Other specified counseling: Secondary | ICD-10-CM

## 2024-07-06 MED ORDER — DUPIXENT 300 MG/2ML ~~LOC~~ SOAJ
300.0000 mg | SUBCUTANEOUS | 11 refills | Status: AC
  Filled 2024-07-07 – 2024-07-29 (×2): qty 4, 28d supply, fill #0
  Filled 2024-08-26: qty 4, 28d supply, fill #1
  Filled 2024-09-27: qty 4, 28d supply, fill #2
  Filled 2024-10-21: qty 4, 28d supply, fill #3

## 2024-07-06 NOTE — Progress Notes (Signed)
   S: Patient presents for review of their specialty medication therapy.  Patient is about to start taking Dupixent  for atopic dermatitis. Patient is managed by Dr. Paci for this.   Adherence: has not started  Efficacy: has not started   Dosing: 300 mg q14day  Dose adjustments: Renal: no dose adjustments (has not been studied) Hepatic: no dose adjustments (has not been studied)  Monitoring: S/sx of infection: has not started  S/sx of hypersensitivity: has not started  S/sx of ocular effects: has not started  S/sx of eosinophilia/vasculitis: has not started   O:     Lab Results  Component Value Date   WBC 5.0 12/10/2023   HGB 13.8 12/10/2023   HCT 41.2 12/10/2023   MCV 89 12/10/2023   PLT 383 12/10/2023      Chemistry      Component Value Date/Time   NA 139 06/03/2024 1449   K 4.2 06/03/2024 1449   CL 101 06/03/2024 1449   CO2 22 06/03/2024 1449   BUN 11 06/03/2024 1449   CREATININE 0.75 06/03/2024 1449   CREATININE 0.68 04/23/2021 0000      Component Value Date/Time   CALCIUM 9.5 06/03/2024 1449   ALKPHOS 68 12/10/2023 0929   AST 19 12/10/2023 0929   ALT 16 12/10/2023 0929   BILITOT 0.4 12/10/2023 0929       A/P: 1. Medication review: Patient currently on Dupixent  for atopic dermatitis. Reviewed the medication with the patient, including the following: Dupixent  is a monoclonal antibody used for the treatment of asthma or atopic dermatitis. Patient educated on purpose, proper use and potential adverse effects of Dupixent . Possible adverse effects include increased risk of infection, ocular effects, vasculitis/eosinophilia, and hypersensitivity reactions. Administer as a SubQ injection and rotate sites. Allow the medication to reach room temp prior to administration (45 mins for 300 mg syringe or 30 min for 200 mg syringe). Do not shake. Discard any unused portion. No recommendations for any changes.  Herlene Fleeta Morris, PharmD, JAQUELINE, CPP Clinical  Pharmacist [facility] & [facility] [phone]

## 2024-07-06 NOTE — Progress Notes (Signed)
 See OV from 07/06/24 for additional documentation.

## 2024-07-07 ENCOUNTER — Other Ambulatory Visit: Payer: Self-pay

## 2024-07-08 ENCOUNTER — Other Ambulatory Visit: Payer: Self-pay

## 2024-07-09 ENCOUNTER — Other Ambulatory Visit: Payer: Self-pay

## 2024-07-09 NOTE — Progress Notes (Signed)
 Office will be giving samples for loading dose

## 2024-07-12 ENCOUNTER — Other Ambulatory Visit: Payer: Self-pay

## 2024-07-12 ENCOUNTER — Ambulatory Visit (HOSPITAL_COMMUNITY)
Admission: RE | Admit: 2024-07-12 | Discharge: 2024-07-12 | Disposition: A | Discharge status: Home / Self Care | Source: Ambulatory Visit | Attending: Internal Medicine | Admitting: Internal Medicine

## 2024-07-12 DIAGNOSIS — I2089 Other forms of angina pectoris: Secondary | ICD-10-CM | POA: Diagnosis not present

## 2024-07-12 DIAGNOSIS — I2584 Coronary atherosclerosis due to calcified coronary lesion: Secondary | ICD-10-CM | POA: Diagnosis not present

## 2024-07-12 DIAGNOSIS — E785 Hyperlipidemia, unspecified: Secondary | ICD-10-CM | POA: Insufficient documentation

## 2024-07-12 DIAGNOSIS — R079 Chest pain, unspecified: Secondary | ICD-10-CM | POA: Insufficient documentation

## 2024-07-12 MED ORDER — NITROGLYCERIN 0.4 MG SL SUBL
0.8000 mg | SUBLINGUAL_TABLET | Freq: Once | SUBLINGUAL | Status: AC
  Administered 2024-07-12: 0.8 mg via SUBLINGUAL

## 2024-07-12 MED ORDER — IOHEXOL 350 MG/ML SOLN
100.0000 mL | Freq: Once | INTRAVENOUS | Status: AC | PRN
Start: 2024-07-12 — End: 2024-07-12
  Administered 2024-07-12: 100 mL via INTRAVENOUS

## 2024-07-19 ENCOUNTER — Other Ambulatory Visit: Payer: Self-pay

## 2024-07-20 ENCOUNTER — Ambulatory Visit: Attending: Internal Medicine | Admitting: Internal Medicine

## 2024-07-20 ENCOUNTER — Other Ambulatory Visit (HOSPITAL_COMMUNITY): Payer: Self-pay

## 2024-07-20 ENCOUNTER — Encounter: Payer: Self-pay | Admitting: Internal Medicine

## 2024-07-20 ENCOUNTER — Other Ambulatory Visit (HOSPITAL_BASED_OUTPATIENT_CLINIC_OR_DEPARTMENT_OTHER): Payer: Self-pay | Admitting: Family Medicine

## 2024-07-20 VITALS — BP 120/70 | HR 75 | Ht 66.0 in | Wt 173.0 lb

## 2024-07-20 DIAGNOSIS — I2583 Coronary atherosclerosis due to lipid rich plaque: Secondary | ICD-10-CM

## 2024-07-20 DIAGNOSIS — E785 Hyperlipidemia, unspecified: Secondary | ICD-10-CM | POA: Diagnosis not present

## 2024-07-20 DIAGNOSIS — I2510 Atherosclerotic heart disease of native coronary artery without angina pectoris: Secondary | ICD-10-CM | POA: Diagnosis not present

## 2024-07-20 DIAGNOSIS — Z79899 Other long term (current) drug therapy: Secondary | ICD-10-CM | POA: Diagnosis not present

## 2024-07-20 DIAGNOSIS — Z79890 Hormone replacement therapy (postmenopausal): Secondary | ICD-10-CM

## 2024-07-20 MED ORDER — IPRATROPIUM BROMIDE 0.06 % NA SOLN
1.0000 | Freq: Two times a day (BID) | NASAL | 1 refills | Status: DC
  Filled 2024-07-20: qty 15, 30d supply, fill #0
  Filled 2024-08-12: qty 15, 75d supply, fill #1
  Filled 2024-08-18 – 2024-09-02 (×2): qty 15, 30d supply, fill #1

## 2024-07-20 MED ORDER — ROSUVASTATIN CALCIUM 5 MG PO TABS
5.0000 mg | ORAL_TABLET | Freq: Every day | ORAL | 3 refills | Status: AC
  Filled 2024-07-20: qty 90, 90d supply, fill #0
  Filled 2024-10-13: qty 90, 90d supply, fill #1

## 2024-07-20 MED ORDER — COQ10 200 MG PO CAPS: 200.0000 mg | ORAL_CAPSULE | Freq: Every day | ORAL | Status: AC

## 2024-07-20 NOTE — Progress Notes (Signed)
 Cardiology Office Note:  .   Date:  07/20/2024  ID:  Denise Smith, DOB 03/28/1966, MRN [id number] PCP: Knute Thersia Bitters, FNP  [facility] Providers Cardiologist:  Soyla DELENA Merck, MD    History of Present Illness: .   Denise Smith is a 58 y.o. female.  Discussed the use of AI scribe software for clinical note transcription with the patient, who gave verbal consent to proceed.  History of Present Illness Denise Smith is a 58 year old female with coronary artery disease who presents for follow-up on her recent diagnostic studies.  Recent diagnostic studies show mild stenosis in the proximal LAD, circumflex, and RCA, with soft plaque in the proximal LAD. No severe blockages were found. Carotid artery studies and echocardiogram indicate normal cardiac function and no valve abnormalities.  She is considering statin therapy but is concerned about potential side effects such as muscle cramps and pain. She has been making dietary changes and plans to recheck her cholesterol levels soon.  She has a 30-year history of smoking, quitting at age 53. She engages in regular exercise, including high-intensity interval training, and has experienced weight loss and muscle gain. She experiences shortness of breath and fatigue and is concerned about how her condition may affect her ability to continue high-intensity exercise.    ROS: negative except per HPI above.  Studies Reviewed: .        Results RADIOLOGY Carotid Ultrasound: Normal, no plaque in carotid arteries. Coronary CT Angiography: No plaque in left main coronary artery, mild plaque in proximal LAD, mild stenosis in circumflex and RCA, all stenosis less than 50%.  DIAGNOSTIC Echocardiogram: Normal systolic function, normal diastolic function, normal strain, right heart normal, valves normal, pressures normal. Risk Assessment/Calculations:       Physical Exam:   VS:  BP 120/70   Pulse 75   Ht 5'  6 (1.676 m)   Wt 173 lb (78.5 kg)   LMP 04/21/2013   SpO2 98%   BMI 27.92 kg/m    Wt Readings from Last 3 Encounters:  07/20/24 173 lb (78.5 kg)  06/03/24 180 lb (81.6 kg)  01/28/24 179 lb (81.2 kg)     Physical Exam GENERAL: Alert, cooperative, well developed, no acute distress. HEENT: Normocephalic, normal oropharynx, moist mucous membranes. CHEST: Clear to auscultation bilaterally, no wheezes, rhonchi, or crackles. CARDIOVASCULAR: Normal heart rate and rhythm, S1 and S2 normal without murmurs. ABDOMEN: Soft, non-tender, non-distended, without organomegaly, normal bowel sounds. EXTREMITIES: No cyanosis or edema. NEUROLOGICAL: Cranial nerves grossly intact, moves all extremities without gross motor or sensory deficit.   ASSESSMENT AND PLAN: .    Assessment and Plan Assessment & Plan Nonobstructive coronary artery disease with mild stenosis and soft plaque Nonobstructive coronary artery disease with mild stenosis in the proximal LAD, circumflex, and RCA. Soft plaque in the proximal LAD increases risk for future cardiovascular events. No severe blockages identified. - Initiate rosuvastatin 5 mg daily to stabilize plaque and lower LDL cholesterol. - Encouraged continuation of current exercise regimen, including HIIT, for cardiovascular health. - Educated on the importance of stabilizing plaque to prevent future cardiovascular events.  Hyperlipidemia in the setting of coronary artery disease Hyperlipidemia with LDL of 164. Target LDL is less than 55 to stabilize and potentially regress coronary plaque. Diet alone is insufficient, requiring pharmacological intervention. - will have labs with her pcp.  - Discuss potential use of PCSK9 inhibitors if statins are not tolerated or LDL goals are unmet. - Educate on  dietary modifications to support cholesterol management, emphasizing reduction of added sugars and processed foods. - Consider CoQ10 supplementation to mitigate potential  statin-related muscle aches.  Postmenopausal state on hormone replacement therapy Postmenopausal state managed with topical hormone replacement therapy. Topcial therapy is overall low risk for cardiovascular events given her profile and current management plan.  Recording duration: 35 minutes  I spent 40 minutes in the care of Denise Smith today including reviewing labs (2/26), reviewing studies (CCTA, echo, carotid u/s), face to face time discussing treatment options (35 min), and documenting in the encounter.     Soyla Merck, MD, FACC

## 2024-07-20 NOTE — Patient Instructions (Signed)
 Medication Instructions:  Start Crestor 5 mg once daily CO Q 10 200 mg once daily (over the counter) *If you need a refill on your cardiac medications before your next appointment, please call your pharmacy*  Lab Work: LFTs, Fasting lipid panel, Lp(a) in 3 months at [facility] If you have labs (blood work) drawn today and your tests are completely normal, you will receive your results only by: MyChart Message (if you have MyChart) OR A paper copy in the mail If you have any lab test that is abnormal or we need to change your treatment, we will call you to review the results.  Testing/Procedures: NONE  Follow-Up: At [facility], you and your health needs are our priority.  As part of our continuing mission to provide you with exceptional heart care, our providers are all part of one team.  This team includes your primary Cardiologist (physician) and Advanced Practice Providers or APPs (Physician Assistants and Nurse Practitioners) who all work together to provide you with the care you need, when you need it.  Your next appointment:   3 month(s)  Provider:   Gayatri A Acharya, MD    We recommend signing up for the patient portal called MyChart.  Sign up information is provided on this After Visit Summary.  MyChart is used to connect with patients for Virtual Visits (Telemedicine).  Patients are able to view lab/test results, encounter notes, upcoming appointments, etc.  Non-urgent messages can be sent to your provider as well.   To learn more about what you can do with MyChart, go to [email or web address].

## 2024-07-21 ENCOUNTER — Other Ambulatory Visit (HOSPITAL_COMMUNITY): Payer: Self-pay

## 2024-07-26 ENCOUNTER — Other Ambulatory Visit: Payer: Self-pay

## 2024-07-27 ENCOUNTER — Other Ambulatory Visit (HOSPITAL_COMMUNITY): Payer: Self-pay

## 2024-07-27 ENCOUNTER — Other Ambulatory Visit: Payer: Self-pay

## 2024-07-27 LAB — HEPATIC FUNCTION PANEL
ALT: 15 IU/L (ref 0–32)
AST: 19 IU/L (ref 0–40)
Albumin: 4.2 g/dL (ref 3.8–4.9)
Alkaline Phosphatase: 68 IU/L (ref 49–135)
Bilirubin Total: 0.5 mg/dL (ref 0.0–1.2)
Bilirubin, Direct: 0.15 mg/dL (ref 0.00–0.40)
Total Protein: 6.3 g/dL (ref 6.0–8.5)

## 2024-07-27 LAB — LIPID PANEL
Chol/HDL Ratio: 2.9 ratio (ref 0.0–4.4)
Cholesterol, Total: 146 mg/dL (ref 100–199)
HDL: 50 mg/dL (ref >39)
LDL Chol Calc (NIH): 83 mg/dL (ref 0–99)
Triglycerides: 67 mg/dL (ref 0–149)
VLDL Cholesterol Cal: 13 mg/dL (ref 5–40)

## 2024-07-27 LAB — LIPOPROTEIN A (LPA): Lipoprotein (a): <8.4 nmol/L (ref <75.0)

## 2024-07-29 ENCOUNTER — Ambulatory Visit (HOSPITAL_BASED_OUTPATIENT_CLINIC_OR_DEPARTMENT_OTHER): Admitting: Family Medicine

## 2024-07-29 ENCOUNTER — Ambulatory Visit (INDEPENDENT_AMBULATORY_CARE_PROVIDER_SITE_OTHER): Admitting: Dermatology

## 2024-07-29 ENCOUNTER — Encounter (HOSPITAL_BASED_OUTPATIENT_CLINIC_OR_DEPARTMENT_OTHER): Payer: Self-pay | Admitting: Certified Nurse Midwife

## 2024-07-29 ENCOUNTER — Other Ambulatory Visit: Payer: Self-pay

## 2024-07-29 ENCOUNTER — Other Ambulatory Visit (HOSPITAL_BASED_OUTPATIENT_CLINIC_OR_DEPARTMENT_OTHER): Payer: Self-pay

## 2024-07-29 ENCOUNTER — Encounter (HOSPITAL_BASED_OUTPATIENT_CLINIC_OR_DEPARTMENT_OTHER): Payer: Self-pay | Admitting: Family Medicine

## 2024-07-29 ENCOUNTER — Encounter: Payer: Self-pay | Admitting: Dermatology

## 2024-07-29 ENCOUNTER — Other Ambulatory Visit (HOSPITAL_COMMUNITY): Payer: Self-pay

## 2024-07-29 VITALS — BP 127/81 | HR 72 | Ht 66.0 in | Wt 170.0 lb

## 2024-07-29 DIAGNOSIS — L308 Other specified dermatitis: Secondary | ICD-10-CM

## 2024-07-29 DIAGNOSIS — R7303 Prediabetes: Secondary | ICD-10-CM | POA: Diagnosis not present

## 2024-07-29 DIAGNOSIS — G8929 Other chronic pain: Secondary | ICD-10-CM | POA: Diagnosis not present

## 2024-07-29 DIAGNOSIS — M25551 Pain in right hip: Secondary | ICD-10-CM

## 2024-07-29 DIAGNOSIS — R0683 Snoring: Secondary | ICD-10-CM

## 2024-07-29 DIAGNOSIS — Z1231 Encounter for screening mammogram for malignant neoplasm of breast: Secondary | ICD-10-CM

## 2024-07-29 DIAGNOSIS — L309 Dermatitis, unspecified: Secondary | ICD-10-CM | POA: Diagnosis not present

## 2024-07-29 DIAGNOSIS — E7800 Pure hypercholesterolemia, unspecified: Secondary | ICD-10-CM | POA: Diagnosis not present

## 2024-07-29 DIAGNOSIS — M25512 Pain in left shoulder: Secondary | ICD-10-CM

## 2024-07-29 DIAGNOSIS — N951 Menopausal and female climacteric states: Secondary | ICD-10-CM

## 2024-07-29 LAB — POCT GLYCOSYLATED HEMOGLOBIN (HGB A1C)
HbA1c POC (<> result, manual entry): 5.5 % (ref 4.0–5.6)
Hemoglobin A1C: 5.5 % (ref 4.0–5.6)

## 2024-07-29 MED ORDER — ESTRADIOL 0.01 % VA CREA
TOPICAL_CREAM | VAGINAL | 3 refills | Status: AC
  Filled 2024-07-29: qty 42.5, 90d supply, fill #0
  Filled 2024-08-18 – 2024-10-25 (×4): qty 42.5, 90d supply, fill #1

## 2024-07-29 MED ORDER — DUPILUMAB 300 MG/2ML ~~LOC~~ SOAJ
600.0000 mg | Freq: Once | SUBCUTANEOUS | Status: AC
  Administered 2024-07-29: 600 mg via SUBCUTANEOUS

## 2024-07-29 NOTE — Progress Notes (Signed)
 Specialty Pharmacy Initial Fill Coordination Note  Denise Smith is a 58 y.o. female contacted today regarding initial fill of specialty medication(s) Dupilumab  (Dupixent )   Patient requested Delivery   Delivery date: 08/06/24   Verified address: [address]   Medication will be filled on 10/23.   Patient is aware of $0 copayment.

## 2024-07-29 NOTE — Progress Notes (Signed)
   Follow-Up Visit   Subjective  Denise Smith is a 58 y.o. female who presents for the following: Injection training for Dupixent    The following portions of the chart were reviewed this encounter and updated as appropriate: medications, allergies, medical history  Review of Systems:  No other skin or systemic complaints except as noted in HPI or Assessment and Plan.  Objective  Well appearing patient in no apparent distress; mood and affect are within normal limits.  A focused examination was performed of the following areas: Bilateral thighs  Bilateral forearms  Relevant exam findings are noted in the Assessment and Plan.    Assessment & Plan   ECZEMA-LIKE RASH- >10% BSA Exam: Scaly pink papules coalescing to plaques  Improved but not at goal, failed topical steroids and TCS  Atopic dermatitis (eczema) is a chronic, relapsing, pruritic condition that can significantly affect quality of life. It is often associated with allergic rhinitis and/or asthma and can require treatment with topical medications, phototherapy, or in severe cases biologic injectable medication (Dupixent ; Adbry) or Oral JAK inhibitors.  Treatment Plan: Patient stated it cleared up but said the clobetasol  stained everything.   Recommend gentle skin care.   Plan: Dupixent  Initiation Indications:  Patient isn't a candidate for systemic therapy with methotrexate or cyclosporine. Patient has been unresponsive to aggressive topical therapy.  Failed Treatments: Topical Steroids and Topical Protopic  Treatment Protocol: 600 mg [place] day 0 then 300 mg [place] every other week  Specific Contraindications Cyclosporine is contraindicated because the patient will not be able to complete the necessary follow-up labs. Methotrexate is contraindicated because the patient will not be able to complete the necessary follow-up labs. Phototherapy is contraindicated because the patient lives too far from the treatment  location.  Dupixent  Counseling: I discussed with the patient the risks of dupilumab  including but not limited to eye infection and irritation, cold sores, injection site reactions, worsening of asthma, allergic reactions and increased risk of parasitic infection. Live vaccines should be avoided while taking dupilumab . Dupilumab  will also interact with certain medications such as warfarin and cyclosporine. The patient understands that monitoring is required and they must alert us  or the primary physician if symptoms of infection or other concerning signs are noted.  Dupixent  Monitoring: There is no laboratory monitoring requirement with Dupixent .   Sending in RX discussed at last visit after getting it approved  - Patient instructed how to complete Dupixent  Injections with Demonstration Pen - Patient completed Injections while in office at [facility] Thighs, Mild erythema noted at injection site.   - Advised to continue topicals for break through Eczema Flares - Patient tolerated well and demonstrated understanding - Recommend gentle skin care. - Plan to follow up in 4 months   DUPIXENT  NDC: 9975-4084-79 LOT: [id number] EXP: 09/12/2026   OTHER ECZEMA   Related Medications Dupilumab  SOAJ 600 mg   Return in about 3 months (around 10/29/2024) for eczema follow up.  I, Berwyn Lesches, Surg Tech III, am acting as scribe for RUFUS CHRISTELLA HOLY, MD.   Documentation: I have reviewed the above documentation for accuracy and completeness, and I agree with the above.  RUFUS CHRISTELLA HOLY, MD

## 2024-07-29 NOTE — Progress Notes (Signed)
 Subjective:   Denise Smith 05-Apr-1966 07/29/2024  Chief Complaint  Patient presents with   Medical Management of Chronic Issues    31-month follow up; states that she has been having pain in her shoulder, hip, and has been having problems with sleeping.    HPI: Denise Smith presents today for re-assessment and management of chronic medical conditions.  HYPERLIPIDEMIA: Denise Smith presents for the medical management of hyperlipidemia.  Patient's current HLD regimen is: Crestor 5mg  & CoQ10 daily.  Patient is currently taking prescribed medications for HLD.  Adhering to heathy diet: yes Exercising regularly: yes Denies myalgias.  Lab Results  Component Value Date   CHOL 146 07/26/2024   HDL 50 07/26/2024   LDLCALC 83 07/26/2024   TRIG 67 07/26/2024   CHOLHDL 2.9 07/26/2024   IMPAIRED FASTING GLUCOSE Denise Smith is here for medical management of impaired fasting glucose.  Patient's current IFG medication regimen is: diet & exercise  Adhering to a diabetic diet: yes Exercising Regularly: yes Checking Blood Sugars: no Denies polydipsia, polyphagia, polyuria.  Lab Results  Component Value Date   HGBA1C 5.5 07/29/2024   HGBA1C 5.5 07/29/2024   SLEEP:  Trouble sleeping for many years. She started using estrogen patches with some help, but still having trouble. Each night, she dims the lights, puts her phone away, drinks sleepytime tea & takes magnesium. She reports waking up during the night around 2-3am. She denies mind racing, fearful thoughts. She also states that her husband tells her that she snores, and she reports waking herself up sometimes to catch her breath. She also reports headaches several times a week & states she is often tired, despite sleeping at least 6hrs per night.   SHOULDER & HIP PAIN:  She reports left shoulder and right hip pain x5-81mo since starting high interval training classes at a local gym. Denies acute trauma  or injury. Pain is mild-moderate at its worst. Pain improves with rest and worsens with repetitive movement. She has taken ibuprofen  for the pain with some relief, but states she does not want to rely on an NSAID daily. She expresses interest in learning new stretches and possibly PT to help with this pain.   The following portions of the patient's history were reviewed and updated as appropriate: past medical history, past surgical history, family history, social history, allergies, medications, and problem list.   Patient Active Problem List   Diagnosis Date Noted   Prediabetes 01/28/2024   Elevated cholesterol 01/28/2024   Angina pectoris 01/28/2024   Vegan diet 12/10/2023   Family history of breast cancer 12/10/2023   Family history of osteoporosis in mother 12/10/2023   Clinical trial participant 12/03/2023   Overweight (BMI 25.0-29.9) 11/11/2023   Lichen sclerosus 12/21/2021   Overactive bladder 12/21/2021   Attention deficit disorder 06/12/2020   Rotator cuff arthropathy, left 08/02/2019   Chronic migraine without aura without status migrainosus, not intractable 08/02/2019   Menopausal symptom 08/02/2019   Genetic testing 12/16/2017   Family history of breast cancer in sister    Family history of bladder cancer    Family history of leukemia    Family history of skin cancer    Family history of kidney cancer    Leiomyoma of uterus, unspecified 05/08/2013   Chondromalacia of patellofemoral joint, left 08/03/2008   Past Medical History:  Diagnosis Date   Anemia    Arthritis    Cataract 2019   Family history of bladder cancer  Family history of breast cancer    Family history of kidney cancer    Family history of leukemia    Family history of skin cancer    Headache(784.0)    Heart murmur 2009   Past Surgical History:  Procedure Laterality Date   ABDOMINAL HYSTERECTOMY N/A 05/06/2013   Procedure: TOTAL ABDOMINAL HYSTERECTOMY;  Surgeon: Charlie CHRISTELLA Croak, MD;   Location: [facility];  Service: Gynecology;  Laterality: N/A;   COLON RESECTION  1997   COLON SURGERY  1996   LYSIS OF ADHESION N/A 05/06/2013   Procedure: LYSIS OF ADHESION;  Surgeon: Charlie CHRISTELLA Croak, MD;  Location: [facility];  Service: Gynecology;  Laterality: N/A;   MOUTH SURGERY     TOTAL ABDOMINAL HYSTERECTOMY     Family History  Problem Relation Age of Onset   Breast cancer Mother 37   Arthritis Mother    Skin cancer Mother    Hypertension Mother        Father   Bladder Cancer Father 65   Kidney cancer Father        dx 69's, thinks it was a second primary rather than met   Arrhythmia Father    Cancer Father    Hypertension Father    Breast cancer Sister        dx 48's.    Leukemia Maternal Grandfather    Skin cancer Maternal Grandfather        'a couple removed in his 69's'   Heart disease Paternal Grandmother        Father, grandmother   Heart attack Paternal Grandmother        Father, grandmother   Breast cancer Other    Breast cancer Other    Anemia Sister    Outpatient Medications Prior to Visit  Medication Sig Dispense Refill   betamethasone  dipropionate 0.05 % cream Apply a thin layer to affected area 2 times a day as needed for itching 60 g 0   Coenzyme Q10 (COQ10) 200 MG CAPS Take 200 mg by mouth daily.     Dupilumab  (DUPIXENT ) 300 MG/2ML SOAJ Inject 300 mg into the skin every 14 (fourteen) days. Starting at day 15 for maintenance. 4 mL 11   estradiol  (ESTRACE ) 0.1 MG/GM vaginal cream Place 1 gram vaginally 2 (two) times a week as directed. 42.5 g 4   estradiol  (VIVELLE -DOT) 0.0375 MG/24HR Place 1 patch onto the skin 2 (two) times a week. 24 patch 3   ipratropium (ATROVENT ) 0.06 % nasal spray Place 1 spray into both nostrils 2 (two) times daily. 15 mL 1   nitroGLYCERIN  (NITROSTAT ) 0.4 MG SL tablet Place 1 tablet (0.4 mg total) under the tongue every 5 (five) minutes as needed for chest pain. max 3 tablets in 15 minutes. if no relief call 911 50 tablet 3    rosuvastatin (CRESTOR) 5 MG tablet Take 1 tablet (5 mg total) by mouth daily. 90 tablet 3   Testosterone  20 % CREA      metoprolol  tartrate (LOPRESSOR ) 100 MG tablet Take 1 tablet (100 mg total) by mouth once for 1 dose. Take 2 hours before your Cardiac CT 1 tablet 0   No facility-administered medications prior to visit.   No Known Allergies   ROS: A complete ROS was performed with pertinent positives/negatives noted in the HPI. The remainder of the ROS are negative.    Objective:   Today's Vitals   07/29/24 0827  BP: 127/81  Pulse: 72  SpO2: 100%  Weight: 77.1  kg  Height: 5' 6 (1.676 m)    GENERAL: Well-appearing, in NAD. Well nourished.  SKIN: Pink, warm and dry. No rash, lesion, ulceration, or ecchymoses.  Head: Normocephalic. NECK: Trachea midline.  THROAT:  Mucous membranes pink and moist.  RESPIRATORY: Chest wall symmetrical. Respirations even and non-labored.  CARDIAC: S1, S2 present, regular rate and rhythm without murmur or gallops. Peripheral pulses 2+ bilaterally.  MSK: Muscle tone and strength appropriate for age. Joints w/o redness, or swelling. Equal strength in BUE and BLE. No pain with direct palpation of AC joint. +pain with rotation of the shoulder with abduction and adduction. + anterior labral test involving RLE. No pain with direct palpation of right bursa. FROM lumbar spine, no pain with palpation.  EXTREMITIES: Without clubbing, cyanosis, or edema.  NEUROLOGIC: No motor or sensory deficits. Steady, even gait. C2-C12 intact.  PSYCH/MENTAL STATUS: Alert, oriented x 3. Cooperative, appropriate mood and affect.   Health Maintenance Due  Topic Date Due   Pneumococcal Vaccine: 50+ Years (1 of 2 - PCV) Never done   Hepatitis B Vaccines 19-59 Average Risk (1 of 3 - 19+ 3-dose series) Never done   COVID-19 Vaccine (8 - 2025-26 season) 06/14/2024   Mammogram  08/12/2024    Results for orders placed or performed in visit on 07/29/24  POCT glycosylated  hemoglobin (Hb A1C)  Result Value Ref Range   Hemoglobin A1C 5.5 4.0 - 5.6 %   HbA1c POC (<> result, manual entry) 5.5 4.0 - 5.6 %   HbA1c, POC (prediabetic range)     HbA1c, POC (controlled diabetic range)      The 10-year ASCVD risk score (Arnett DK, et al., 2019) is: 1.9%     Assessment & Plan:  1. Elevated cholesterol (Primary) Controlled; takes Crestor 5mg  and CoQ10 daily. Encouraged pt to continue healthy diet & exercise.    2. Prediabetes Controlled with healthy diet & exercise. POC A1c today was 5.5%. Encouraged pt to continue healthy diet & exercise.  - POCT glycosylated hemoglobin (Hb A1C)  3. Chronic left shoulder pain 4. Chronic right hip pain Likely related to a combination of high intensity exercise 4x weekly and arthritis. Pt encouraged to try Voltaren gel on left shoulder and right hip, and to alternate ice and heat at a time. Referral to PT placed.  - Ambulatory referral to Physical Therapy  5. Snoring Referral to pulmonology placed to r/o sleep apnea.  - Ambulatory referral to Pulmonology  6. Breast cancer screening by mammogram Order placed for mammogram.  - MM 3D SCREENING MAMMOGRAM BILATERAL BREAST; Future    No orders of the defined types were placed in this encounter.  Lab Orders         POCT glycosylated hemoglobin (Hb A1C)     No images are attached to the encounter or orders placed in the encounter.  Return in about 6 months (around 01/27/2025) for ANNUAL PHYSICAL (after 4/16) .    Patient to reach out to office if new, worrisome, or unresolved symptoms arise or if no improvement in patient's condition. Patient verbalized understanding and is agreeable to treatment plan. All questions answered to patient's satisfaction.   Treatment plan and recommendation(s) reviewed by supervising preceptor, Thersia CLEMENTEEN Stark, FNP-C, prior to clinic discharge.   Rosina Ada, BSN, RN  DNP Student

## 2024-07-29 NOTE — Progress Notes (Signed)
 Subjective:   Denise Smith 1965/11/19 07/29/2024  Chief Complaint  Patient presents with   Medical Management of Chronic Issues    57-month follow up; states that she has been having pain in her shoulder, hip, and has been having problems with sleeping.    HPI: Denise Smith presents today for re-assessment and management of chronic medical conditions.  HYPERLIPIDEMIA: Shelbylynn Louise Smith presents for the medical management of hyperlipidemia.  Patient's current HLD regimen is: Crestor 5mg  & CoQ10 daily.  Patient is currently taking prescribed medications for HLD.  Adhering to heathy diet: yes Exercising regularly: yes Denies myalgias.  Lab Results  Component Value Date   CHOL 146 07/26/2024   HDL 50 07/26/2024   LDLCALC 83 07/26/2024   TRIG 67 07/26/2024   CHOLHDL 2.9 07/26/2024   IMPAIRED FASTING GLUCOSE Denise Smith is here for medical management of impaired fasting glucose.  Patient's current IFG medication regimen is: diet & exercise  Adhering to a diabetic diet: yes Exercising Regularly: yes Checking Blood Sugars: no Denies polydipsia, polyphagia, polyuria.  Lab Results  Component Value Date   HGBA1C 5.5 07/29/2024   HGBA1C 5.5 07/29/2024   SLEEP:  Trouble sleeping for many years. She started using estrogen patches with some help, but still having trouble. Each night, she dims the lights, puts her phone away, drinks sleepytime tea & takes magnesium. She reports waking up during the night around 2-3am. She denies mind racing, fearful thoughts. She also states that her husband tells her that she snores, and she reports waking herself up sometimes to catch her breath. She also reports headaches several times a week & states she is often tired, despite sleeping at least 6hrs per night.   SHOULDER & HIP PAIN:  She reports left shoulder and right hip pain x5-36mo since starting high interval training classes at a local gym. Denies acute trauma  or injury. Pain is mild-moderate at its worst. Pain improves with rest and worsens with repetitive movement. She has taken ibuprofen  for the pain with some relief, but states she does not want to rely on an NSAID daily. She expresses interest in learning new stretches and possibly PT to help with this pain.   The following portions of the patient's history were reviewed and updated as appropriate: past medical history, past surgical history, family history, social history, allergies, medications, and problem list.   Patient Active Problem List   Diagnosis Date Noted   Prediabetes 01/28/2024   Elevated cholesterol 01/28/2024   Angina pectoris 01/28/2024   Vegan diet 12/10/2023   Family history of breast cancer 12/10/2023   Family history of osteoporosis in mother 12/10/2023   Clinical trial participant 12/03/2023   Overweight (BMI 25.0-29.9) 11/11/2023   Lichen sclerosus 12/21/2021   Overactive bladder 12/21/2021   Attention deficit disorder 06/12/2020   Rotator cuff arthropathy, left 08/02/2019   Chronic migraine without aura without status migrainosus, not intractable 08/02/2019   Menopausal symptom 08/02/2019   Genetic testing 12/16/2017   Family history of breast cancer in sister    Family history of bladder cancer    Family history of leukemia    Family history of skin cancer    Family history of kidney cancer    Leiomyoma of uterus, unspecified 05/08/2013   Chondromalacia of patellofemoral joint, left 08/03/2008   Past Medical History:  Diagnosis Date   Anemia    Arthritis    Cataract 2019   Family history of bladder cancer  Family history of breast cancer    Family history of kidney cancer    Family history of leukemia    Family history of skin cancer    Headache(784.0)    Heart murmur 2009   Past Surgical History:  Procedure Laterality Date   ABDOMINAL HYSTERECTOMY N/A 05/06/2013   Procedure: TOTAL ABDOMINAL HYSTERECTOMY;  Surgeon: Charlie CHRISTELLA Croak, MD;   Location: [facility];  Service: Gynecology;  Laterality: N/A;   COLON RESECTION  1997   COLON SURGERY  1996   LYSIS OF ADHESION N/A 05/06/2013   Procedure: LYSIS OF ADHESION;  Surgeon: Charlie CHRISTELLA Croak, MD;  Location: [facility];  Service: Gynecology;  Laterality: N/A;   MOUTH SURGERY     TOTAL ABDOMINAL HYSTERECTOMY     Family History  Problem Relation Age of Onset   Breast cancer Mother 74   Arthritis Mother    Skin cancer Mother    Hypertension Mother        Father   Bladder Cancer Father 72   Kidney cancer Father        dx 27's, thinks it was a second primary rather than met   Arrhythmia Father    Cancer Father    Hypertension Father    Breast cancer Sister        dx 36's.    Leukemia Maternal Grandfather    Skin cancer Maternal Grandfather        'a couple removed in his 55's'   Heart disease Paternal Grandmother        Father, grandmother   Heart attack Paternal Grandmother        Father, grandmother   Breast cancer Other    Breast cancer Other    Anemia Sister    Outpatient Medications Prior to Visit  Medication Sig Dispense Refill   betamethasone  dipropionate 0.05 % cream Apply a thin layer to affected area 2 times a day as needed for itching 60 g 0   Coenzyme Q10 (COQ10) 200 MG CAPS Take 200 mg by mouth daily.     Dupilumab  (DUPIXENT ) 300 MG/2ML SOAJ Inject 300 mg into the skin every 14 (fourteen) days. Starting at day 15 for maintenance. 4 mL 11   estradiol  (ESTRACE ) 0.1 MG/GM vaginal cream Place 1 gram vaginally 2 (two) times a week as directed. 42.5 g 4   estradiol  (VIVELLE -DOT) 0.0375 MG/24HR Place 1 patch onto the skin 2 (two) times a week. 24 patch 3   ipratropium (ATROVENT ) 0.06 % nasal spray Place 1 spray into both nostrils 2 (two) times daily. 15 mL 1   nitroGLYCERIN  (NITROSTAT ) 0.4 MG SL tablet Place 1 tablet (0.4 mg total) under the tongue every 5 (five) minutes as needed for chest pain. max 3 tablets in 15 minutes. if no relief call 911 50 tablet 3    rosuvastatin (CRESTOR) 5 MG tablet Take 1 tablet (5 mg total) by mouth daily. 90 tablet 3   Testosterone  20 % CREA      metoprolol  tartrate (LOPRESSOR ) 100 MG tablet Take 1 tablet (100 mg total) by mouth once for 1 dose. Take 2 hours before your Cardiac CT 1 tablet 0   No facility-administered medications prior to visit.   No Known Allergies   ROS: A complete ROS was performed with pertinent positives/negatives noted in the HPI. The remainder of the ROS are negative.    Objective:   Today's Vitals   07/29/24 0827  BP: 127/81  Pulse: 72  SpO2: 100%  Weight: 170  lb (77.1 kg)  Height: 5' 6 (1.676 m)    GENERAL: Well-appearing, in NAD. Well nourished.  SKIN: Pink, warm and dry. No rash, lesion, ulceration, or ecchymoses.  Head: Normocephalic. NECK: Trachea midline.  THROAT:  Mucous membranes pink and moist.  RESPIRATORY: Chest wall symmetrical. Respirations even and non-labored.  CARDIAC: S1, S2 present, regular rate and rhythm without murmur or gallops. Peripheral pulses 2+ bilaterally.  MSK: Muscle tone and strength appropriate for age. Joints w/o redness, or swelling. Equal strength in BUE and BLE. No pain with direct palpation of AC joint. +pain with rotation of the shoulder with abduction and adduction. + anterior labral test involving RLE. No pain with direct palpation of right bursa. FROM lumbar spine, no pain with palpation.  EXTREMITIES: Without clubbing, cyanosis, or edema.  NEUROLOGIC: No motor or sensory deficits. Steady, even gait. C2-C12 intact.  PSYCH/MENTAL STATUS: Alert, oriented x 3. Cooperative, appropriate mood and affect.   Health Maintenance Due  Topic Date Due   Pneumococcal Vaccine: 50+ Years (1 of 2 - PCV) Never done   Hepatitis B Vaccines 19-59 Average Risk (1 of 3 - 19+ 3-dose series) Never done   COVID-19 Vaccine (8 - 2025-26 season) 06/14/2024   Mammogram  08/12/2024    Results for orders placed or performed in visit on 07/29/24  POCT glycosylated  hemoglobin (Hb A1C)  Result Value Ref Range   Hemoglobin A1C 5.5 4.0 - 5.6 %   HbA1c POC (<> result, manual entry) 5.5 4.0 - 5.6 %   HbA1c, POC (prediabetic range)     HbA1c, POC (controlled diabetic range)      The 10-year ASCVD risk score (Arnett DK, et al., 2019) is: 1.9%     Assessment & Plan:  1. Elevated cholesterol (Primary) Controlled; takes Crestor 5mg  and CoQ10 daily. Encouraged pt to continue healthy diet & exercise.  Has f/u with Cardiology for repeat LP scheduled.   2. Prediabetes Controlled with healthy diet & exercise. POC A1c today was 5.5%. Encouraged pt to continue healthy diet & exercise.  - POCT glycosylated hemoglobin (Hb A1C)  3. Chronic left shoulder pain 4. Chronic right hip pain Likely related to a combination of high intensity exercise 4x weekly and arthritis. Pt encouraged to try Voltaren gel on left shoulder and right hip, and to alternate ice and heat at a time. Referral to PT placed.  - Ambulatory referral to Physical Therapy  5. Snoring Referral to pulmonology placed to r/o sleep apnea. Discussed use of Sleep 3 OTC and will trial this.  - Ambulatory referral to Pulmonology  6. Breast cancer screening by mammogram Order placed for mammogram.  - MM 3D SCREENING MAMMOGRAM BILATERAL BREAST; Future    No orders of the defined types were placed in this encounter.  Lab Orders         POCT glycosylated hemoglobin (Hb A1C)      Return in about 6 months (around 01/27/2025) for ANNUAL PHYSICAL (after 4/16) .    Patient to reach out to office if new, worrisome, or unresolved symptoms arise or if no improvement in patient's condition. Patient verbalized understanding and is agreeable to treatment plan. All questions answered to patient's satisfaction.   Thersia Stark, FNP

## 2024-08-05 ENCOUNTER — Telehealth: Payer: Self-pay

## 2024-08-05 ENCOUNTER — Other Ambulatory Visit: Payer: Self-pay

## 2024-08-05 NOTE — Telephone Encounter (Signed)
 Pharmacy Patient Advocate Encounter   Received notification from Patient Pharmacy that prior authorization for Dupixent  is required/requested.   Insurance verification completed.   The patient is insured through [facility].   Per test claim: PA required; PA submitted to above mentioned insurance via Latent Key/confirmation #/EOC [id number] Status is pending

## 2024-08-05 NOTE — Telephone Encounter (Signed)
 Pharmacy Patient Advocate Encounter  Received notification from [facility] that Prior Authorization for Dupixent  has been APPROVED from 08/05/24 to 08/04/25   PA #/Case ID/Reference #: [employer]

## 2024-08-12 ENCOUNTER — Other Ambulatory Visit (HOSPITAL_COMMUNITY): Payer: Self-pay

## 2024-08-16 ENCOUNTER — Encounter: Payer: Self-pay | Admitting: Radiology

## 2024-08-18 ENCOUNTER — Encounter: Payer: Self-pay | Admitting: Physician Assistant

## 2024-08-18 ENCOUNTER — Other Ambulatory Visit (HOSPITAL_COMMUNITY): Payer: Self-pay

## 2024-08-18 ENCOUNTER — Ambulatory Visit (INDEPENDENT_AMBULATORY_CARE_PROVIDER_SITE_OTHER): Admitting: Physician Assistant

## 2024-08-18 VITALS — BP 128/83

## 2024-08-18 DIAGNOSIS — L309 Dermatitis, unspecified: Secondary | ICD-10-CM | POA: Diagnosis not present

## 2024-08-18 DIAGNOSIS — L308 Other specified dermatitis: Secondary | ICD-10-CM

## 2024-08-18 DIAGNOSIS — R21 Rash and other nonspecific skin eruption: Secondary | ICD-10-CM

## 2024-08-18 MED ORDER — HYDROCORTISONE 2.5 % EX OINT: TOPICAL_OINTMENT | Freq: Two times a day (BID) | CUTANEOUS | 1 refills | Status: AC

## 2024-08-18 NOTE — Progress Notes (Unsigned)
   Follow-Up Visit   Subjective  Denise Smith is a 58 y.o. female who presents for the following: Possible dupixent  reaction - After loading dose (07/29/24), eczema flared in the creases of her arms. After he second dose (08/12/2024), eczema flared around jaw line, eyelids, abdomen, arm creases, on and under her breast. The reaction times after both injections were the same time frame. She has not had any anaphylaxis reactions - no lip or tongue swelling. She is willing to keep taking the injections if it is deemed safe for her to continue.   The following portions of the chart were reviewed this encounter and updated as appropriate: medications, allergies, medical history  Review of Systems:  No other skin or systemic complaints except as noted in HPI or Assessment and Plan.  Objective  Well appearing patient in no apparent distress; mood and affect are within normal limits.   A focused examination was performed of the following areas:    Relevant exam findings are noted in the Assessment and Plan.  Left Breast, Left Flank Erythematous scaly patches   Assessment & Plan     RASH Left Breast, Left Flank Skin / nail biopsy - Left Breast Type of biopsy: punch   Informed consent: discussed and consent obtained   Timeout: patient name, date of birth, surgical site, and procedure verified   Procedure prep:  Patient was prepped and draped in usual sterile fashion (the patient was cleaned and prepped) Prep type:  Isopropyl alcohol Anesthesia: the lesion was anesthetized in a standard fashion   Anesthetic:  1% lidocaine  w/ epinephrine 1-100,000 buffered w/ 8.4% NaHCO3 Punch size:  3 mm Suture size:  4-0 Suture type: nylon   Hemostasis achieved with: suture, pressure and aluminum chloride   Outcome: patient tolerated procedure well   Post-procedure details: sterile dressing applied and wound care instructions given   Dressing type: bandage, petrolatum and pressure dressing     Skin / nail biopsy - Left Flank Type of biopsy: punch   Informed consent: discussed and consent obtained   Timeout: patient name, date of birth, surgical site, and procedure verified   Procedure prep:  Patient was prepped and draped in usual sterile fashion (the patient was cleaned and prepped) Prep type:  Isopropyl alcohol Anesthesia: the lesion was anesthetized in a standard fashion   Anesthetic:  1% lidocaine  w/ epinephrine 1-100,000 buffered w/ 8.4% NaHCO3 Punch size:  3 mm Suture size:  4-0 Suture type: nylon   Hemostasis achieved with: suture, pressure and aluminum chloride   Outcome: patient tolerated procedure well   Post-procedure details: sterile dressing applied and wound care instructions given   Dressing type: bandage, petrolatum and pressure dressing    Specimen 1 - Surgical pathology Differential Diagnosis: Atopic dermatitis vs CTCL vs other  Check Margins: No  Specimen 2 - Surgical pathology Differential Diagnosis: Atopic dermatitis vs CTCL vs other   Check Margins: No  Return for Follow up as scheduled.  I, Roseline Hutchinson, CMA, am acting as scribe for Curtisha Bendix K, PA-C .   Documentation: I have reviewed the above documentation for accuracy and completeness, and I agree with the above.  Avery Klingbeil K, PA-C

## 2024-08-18 NOTE — Patient Instructions (Signed)

## 2024-08-19 ENCOUNTER — Other Ambulatory Visit: Payer: Self-pay

## 2024-08-19 ENCOUNTER — Encounter: Payer: Self-pay | Admitting: Pharmacist

## 2024-08-19 ENCOUNTER — Ambulatory Visit: Admitting: Dermatology

## 2024-08-19 LAB — SURGICAL PATHOLOGY

## 2024-08-20 ENCOUNTER — Other Ambulatory Visit: Payer: Self-pay

## 2024-08-24 ENCOUNTER — Ambulatory Visit: Payer: Self-pay | Admitting: Internal Medicine

## 2024-08-24 ENCOUNTER — Other Ambulatory Visit: Payer: Self-pay

## 2024-08-24 ENCOUNTER — Encounter: Payer: Self-pay | Admitting: Physician Assistant

## 2024-08-24 ENCOUNTER — Ambulatory Visit: Payer: Self-pay | Admitting: Physician Assistant

## 2024-08-24 DIAGNOSIS — Z79899 Other long term (current) drug therapy: Secondary | ICD-10-CM

## 2024-08-24 DIAGNOSIS — E785 Hyperlipidemia, unspecified: Secondary | ICD-10-CM

## 2024-08-25 ENCOUNTER — Other Ambulatory Visit: Payer: Self-pay

## 2024-08-25 ENCOUNTER — Other Ambulatory Visit (HOSPITAL_COMMUNITY): Payer: Self-pay

## 2024-08-26 ENCOUNTER — Other Ambulatory Visit: Payer: Self-pay

## 2024-08-26 ENCOUNTER — Encounter (INDEPENDENT_AMBULATORY_CARE_PROVIDER_SITE_OTHER): Payer: Self-pay

## 2024-08-26 ENCOUNTER — Other Ambulatory Visit (HOSPITAL_COMMUNITY): Payer: Self-pay

## 2024-08-26 NOTE — Progress Notes (Signed)
 Specialty Pharmacy Refill Coordination Note  MyChart Questionnaire Submission  Denise Smith is a 58 y.o. female contacted today regarding refills of specialty medication(s) Dupixent .  Doses on hand: (Patient-Rptd) 10   Injection date: (Patient-Rptd) 09/09/24  Patient requested: (Patient-Rptd) Delivery   Delivery date: 08/31/24  Verified address: [address]  [place] [address]  Medication will be filled on 08/30/24.

## 2024-08-30 ENCOUNTER — Other Ambulatory Visit: Payer: Self-pay

## 2024-09-02 ENCOUNTER — Other Ambulatory Visit (HOSPITAL_COMMUNITY): Payer: Self-pay

## 2024-09-20 ENCOUNTER — Other Ambulatory Visit (HOSPITAL_COMMUNITY): Payer: Self-pay

## 2024-09-20 ENCOUNTER — Ambulatory Visit (HOSPITAL_BASED_OUTPATIENT_CLINIC_OR_DEPARTMENT_OTHER): Admitting: Physical Therapy

## 2024-09-20 ENCOUNTER — Encounter (HOSPITAL_BASED_OUTPATIENT_CLINIC_OR_DEPARTMENT_OTHER): Payer: Self-pay | Admitting: Physical Therapy

## 2024-09-20 ENCOUNTER — Other Ambulatory Visit: Payer: Self-pay

## 2024-09-20 DIAGNOSIS — G8929 Other chronic pain: Secondary | ICD-10-CM | POA: Diagnosis not present

## 2024-09-20 DIAGNOSIS — M25512 Pain in left shoulder: Secondary | ICD-10-CM | POA: Diagnosis present

## 2024-09-20 DIAGNOSIS — M25551 Pain in right hip: Secondary | ICD-10-CM | POA: Diagnosis present

## 2024-09-20 DIAGNOSIS — M6281 Muscle weakness (generalized): Secondary | ICD-10-CM | POA: Diagnosis present

## 2024-09-20 NOTE — Therapy (Signed)
 OUTPATIENT PHYSICAL THERAPY LOWER EXTREMITY EVALUATION   Patient Name: Denise Smith MRN: [id number] DOB:[birth year], 58 y.o., female Today's Date: 09/20/2024  END OF SESSION:  PT End of Session - 09/20/24 1158     Visit Number 1    Number of Visits 10    Date for Recertification  12/19/24    Authorization Type [facility] [employer]    PT Start Time 0801    PT Stop Time 0846    PT Time Calculation (min) 45 min    Activity Tolerance Patient tolerated treatment well    Behavior During Therapy [facility] for tasks assessed/performed          Past Medical History:  Diagnosis Date   Anemia    Arthritis    Cataract 2019   Family history of bladder cancer    Family history of breast cancer    Family history of kidney cancer    Family history of leukemia    Family history of skin cancer    Headache(784.0)    Heart murmur 2009   Past Surgical History:  Procedure Laterality Date   ABDOMINAL HYSTERECTOMY N/A 05/06/2013   Procedure: TOTAL ABDOMINAL HYSTERECTOMY;  Surgeon: Charlie CHRISTELLA Croak, MD;  Location: [facility];  Service: Gynecology;  Laterality: N/A;   COLON RESECTION  1997   COLON SURGERY  1996   LYSIS OF ADHESION N/A 05/06/2013   Procedure: LYSIS OF ADHESION;  Surgeon: Charlie CHRISTELLA Croak, MD;  Location: [facility];  Service: Gynecology;  Laterality: N/A;   MOUTH SURGERY     TOTAL ABDOMINAL HYSTERECTOMY     Patient Active Problem List   Diagnosis Date Noted   Prediabetes 01/28/2024   Elevated cholesterol 01/28/2024   Angina pectoris 01/28/2024   Vegan diet 12/10/2023   Family history of breast cancer 12/10/2023   Family history of osteoporosis in mother 12/10/2023   Clinical trial participant 12/03/2023   Overweight (BMI 25.0-29.9) 11/11/2023   Lichen sclerosus 12/21/2021   Overactive bladder 12/21/2021   Attention deficit disorder 06/12/2020   Rotator cuff arthropathy, left 08/02/2019   Chronic migraine without aura without status migrainosus, not intractable 08/02/2019    Menopausal symptom 08/02/2019   Genetic testing 12/16/2017   Family history of breast cancer in sister    Family history of bladder cancer    Family history of leukemia    Family history of skin cancer    Family history of kidney cancer    Leiomyoma of uterus, unspecified 05/08/2013   Chondromalacia of patellofemoral joint, left 08/03/2008    PCP:  Knute Thersia Bitters, FNP      REFERRING PROVIDER:  Knute Thersia Bitters, FNP      REFERRING DIAG:  Diagnosis  [phone] (ICD-10-CM) - Chronic left shoulder pain  M25.551,G89.29 (ICD-10-CM) - Chronic right hip pain    THERAPY DIAG:  Pain in right hip  Left shoulder pain, unspecified chronicity  Muscle weakness (generalized)  Rationale for Evaluation and Treatment: Rehabilitation  ONSET DATE: 4 months ago  SUBJECTIVE:   SUBJECTIVE STATEMENT: Pt reports she is here for the L shoulder and R hip pain that started with a period of increased exercise at the gym. Pt does have history of repeated L shoulder pain. Has noticed ROM loss of the shoulder. The hip has been intermittent for quite some time. Pain is episodic. Pt reports feeling of clicking in the hip and low back. Pt reports about 4 months ago she noticed instability of the hip when going up stairs. Pt reports there is  a sciatic type in the R leg. Cannot touch pain of the hip, non palpable. Previous CSI for hip, pain came back within a week. Pt did have some improvement in pain with estrogen therapy.   L shoulder there is crepitus and pain with reaching. Pt goes to [employer]  (boot [employer]) style classes. 3-4x/week for about 1 hour at a time. No olympic lifts. Denies instability. Feels deep. Denies NT. Denies grip strength loss.   PERTINENT HISTORY: 2209 R medial menisectomy  PAIN:  Are you having pain? No: NPRS scale: 0/10 for shoulder, 2/10 for hip  Pain location: R lateral hip and radiating symptoms; L lateral shoulder Pain description: Dull with sharpness of the hip,   Aggravating factors: laying on the hip, internal rotation, sitting legs crossed on the floor, step up, walking, standing long period Shoulder: reaching OH, hanging, pressing Relieving factors: rest  PRECAUTIONS: None  RED FLAGS: None   WEIGHT BEARING RESTRICTIONS: No  FALLS:  Has patient fallen in last 6 months? No  OCCUPATION: RN interventional radiology  PLOF: Independent  PATIENT GOALS: workout as normal, reduce pain, understand her current condition   OBJECTIVE:  Note: Objective measures were completed at Evaluation unless otherwise noted.  DIAGNOSTIC FINDINGS: N/A  PATIENT SURVEYS:  LEFS  Extreme difficulty/unable (0), Quite a bit of difficulty (1), Moderate difficulty (2), Little difficulty (3), No difficulty (4) Survey date:  12/8  Any of your usual work, housework or school activities 3  2. Usual hobbies, recreational or sporting activities 3  3. Getting into/out of the bath 3  4. Walking between rooms 4  5. Putting on socks/shoes 4  6. Squatting  3  7. Lifting an object, like a bag of groceries from the floor 3  8. Performing light activities around your home 3  9. Performing heavy activities around your home 3  10. Getting into/out of a car 4  11. Walking 2 blocks 3  12. Walking 1 mile 2  13. Going up/down 10 stairs (1 flight) 3  14. Standing for 1 hour 2  15.  sitting for 1 hour 2  16. Running on even ground 3  17. Running on uneven ground 3  18. Making sharp turns while running fast 2  19. Hopping  3  20. Rolling over in bed 4  Score total:  60/80    Quick Dash:  QUICK DASH  Please rate your ability do the following activities in the last week by selecting the number below the appropriate response.   Activities Rating  Open a tight or new jar.  2 = Mild difficulty  Do heavy household chores (e.g., wash walls, floors). 2 = Mild difficulty  Carry a shopping bag or briefcase 1 = No difficulty   Wash your back. 1 = No difficulty   Use a knife  to cut food. 1 = No difficulty   Recreational activities in which you take some force or impact through your arm, shoulder or hand (e.g., golf, hammering, tennis, etc.). 3 = Moderate difficulty  During the past week, to what extent has your arm, shoulder or hand problem interfered with your normal social activities with family, friends, neighbors or groups?  2 = Slightly  During the past week, were you limited in your work or other regular daily activities as a result of your arm, shoulder or hand problem? 2 = Slightly limited  Rate the severity of the following symptoms in the last week: Arm, Shoulder, or hand pain. 2 = Mild  Rate the severity of the following symptoms in the last week: Tingling (pins and needles) in your arm, shoulder or hand. 1 = none  During the past week, how much difficulty have you had sleeping because of the pain in your arm, shoulder or hand?  2 = Mild difficulty   QuickDASH Score: 18.2 / 100 = 18.2 %  Minimally Clinically Important Difference (MCID): 15-20 points  (Franchignoni, F. et al. (2013). Minimally clinically important difference of the disabilities of the arm, shoulder, and hand outcome measures (DASH) and its shortened version (Quick DASH). Journal of Orthopaedic & Sports Physical Therapy, 44(1), 30-39)   COGNITION: Overall cognitive status: Within functional limits for tasks assessed     SENSATION: WFL  EDEMA:  None noted  MUSCLE LENGTH: WFL at HS and hip rotators  POSTURE: No Significant postural limitations  PALPATION: TTP of R glutes, L deltoid   L shoulder ROM WFL, lacking ~5 degrees as compared to R with pain at end range  L shoulder MMT: 4/5 throughout no pain LOWER EXTREMITY ROM: WFL but painful with AROM No pain with PROM   LOWER EXTREMITY MMT:  MMT Right eval Left eval  Hip flexion 4+/5 4+/5  Hip extension    Hip abduction 4+/5 4+/5  Hip adduction    Hip internal rotation    Hip external rotation    Knee flexion    Knee  extension    Ankle dorsiflexion    Ankle plantarflexion    Ankle inversion    Ankle eversion     (Blank rows = not tested)  LOWER EXTREMITY SPECIAL TESTS:  Hip special tests: Belvie (FABER) test: negative, Hip scouring test: negative, Anterior hip impingement test: negative, and Piriformis test: negative  FUNCTIONAL TESTS:  Stairs: hip rotation compensation of R with descent, Trendelenburg bilat   GAIT: Distance walked: 89ft Assistive device utilized: None Level of assistance: Complete Independence Comments: decreased L step length                                                                                                                                TREATMENT DATE: 12/8   Exercises - Side Plank on Knees  - 1 x daily - 3 x weekly - 2 sets - 10 reps - Bridge with Posterior Pelvic Tilt  - 1 x daily - 3 x weekly - 2 sets - 10 reps - 10 hold - Standing Shoulder Diagonal Horizontal Abduction 60/120 Degrees with Resistance  - 1 x daily - 3 x weekly - 3 sets - 5 reps - Seated Quadratus Lumborum Stretch with Arm Overhead  - 2 x daily - 7 x weekly - 1 sets - 3 reps - 30 hold    PATIENT EDUCATION:  Education details: MOI, diagnosis, prognosis, anatomy, exercise progression, DOMS expectations, muscle firing,  envelope of function, HEP, POC  Person educated: Patient Education method: Explanation, Demonstration, Tactile cues, Verbal cues, and Handouts Education comprehension: verbalized  understanding, returned demonstration, verbal cues required, and tactile cues required  HOME EXERCISE PROGRAM:  Access Code: [email or web address] URL: [email or web address] Date: 09/20/2024 Prepared by: Dale Call  ASSESSMENT:  CLINICAL IMPRESSION: Patient is a 58 y.o. female who was seen today for physical therapy evaluation and treatment for c/c of R hip and L shoulder pain. Pt's s/s appear consistent with potential R greater trochanteric pain syndrome  with concurrent SIJ pain and L cuff  related pain. Pt's pain is minimally sensitive and irritable with movement. Plan to continue with R hip rotational loading and mobility and L cuff strength and mobility at future sessions. Pt would benefit from continued skilled therapy in order to reach goals and maximize functional UE and LE  strength and ROM for full return to PLOF.    OBJECTIVE IMPAIRMENTS: Abnormal gait, decreased activity tolerance, decreased endurance, decreased mobility, difficulty walking, decreased ROM, decreased strength, increased muscle spasms, impaired UE functional use, improper body mechanics, postural dysfunction, and pain.   ACTIVITY LIMITATIONS: carrying, lifting, bending, sitting, standing, squatting, sleeping, stairs, bathing, dressing, reach over head, hygiene/grooming, and locomotion level  PARTICIPATION LIMITATIONS: cleaning, interpersonal relationship, shopping, community activity, occupation, and gym  PERSONAL FACTORS: Time since onset of injury/illness/exacerbation and 1-2 comorbidities:   are also affecting patient's functional outcome.   REHAB POTENTIAL: Good  CLINICAL DECISION MAKING: Evolving/moderate complexity  EVALUATION COMPLEXITY: Moderate   GOALS:   SHORT TERM GOALS: Target date: 11/01/2024  Pt will become independent with HEP in order to demonstrate synthesis of PT education. Baseline: Goal status: INITIAL  2.  Pt will have an at least 9 pt improvement in LEFS measure in order to demonstrate MCID improvement in daily function.  Baseline:  Goal status: INITIAL  3.  Pt will report at least 2 pt reduction on NPRS scale for pain in order to demonstrate functional improvement with household activity, self care, and ADL.  Baseline:  Goal status: INITIAL    LONG TERM GOALS: Target date: 12/13/2024   Pt  will become independent with final HEP in order to demonstrate synthesis of PT education.  Baseline:  Goal status: INITIAL  2.  Pt will demonstrate at least a 16pt improvement  in Quick DASH in order to demonstrate a clinically significant change in UE pain and function.  Baseline:  Goal status: INITIAL  3.  Pt will be able to reach [place] and carry/hold >10 lbs in order to demonstrate functional improvement in L UE strength for return to PLOF and exercise.  Baseline:  Goal status: INITIAL  4.  Pt will be able to lift/squat/hold >30 lbs in order to demonstrate functional improvement in lumbopelvic strength for return to PLOF and exercise.  Baseline:  Goal status: INITIAL  5.  Pt will be able to demonstrate/report ability to stand/sleep for extended periods of time without pain in order to demonstrate functional improvement and tolerance to static positioning.  Baseline:  Goal status: INITIAL  6.  Pt will be able to demonstrate/report ability to walk >30 mins without pain in order to demonstrate functional improvement and tolerance to exercise and community mobility.  Baseline:  Goal status: INITIAL   PLAN:  PT FREQUENCY: 1x/month or 1x/every 2-3 wks  PT DURATION: 12 weeks  PLANNED INTERVENTIONS: 97164- PT Re-evaluation, 97110-Therapeutic exercises, 97530- Therapeutic activity, 97112- Neuromuscular re-education, 97535- Self Care, 02859- Manual therapy, [phone]- Gait training, [phone]- Aquatic Therapy, [phone]- Vasopneumatic device, [id number]- Traction (mechanical), [phone]- Ionotophoresis 4mg /ml Dexamethasone , Patient/Family education, Balance training, Stair training, Taping, Joint mobilization, Joint manipulation, Spinal manipulation,  Spinal mobilization, Scar mobilization, Cryotherapy, and Moist heat  PLAN FOR NEXT SESSION: lateral step downs, R hip loading, L cuff strength   Dale Call, PT 09/20/2024, 12:48 PM

## 2024-09-27 ENCOUNTER — Other Ambulatory Visit (HOSPITAL_COMMUNITY): Payer: Self-pay

## 2024-09-27 ENCOUNTER — Ambulatory Visit: Payer: Self-pay

## 2024-09-27 ENCOUNTER — Other Ambulatory Visit: Payer: Self-pay

## 2024-09-27 ENCOUNTER — Encounter (HOSPITAL_BASED_OUTPATIENT_CLINIC_OR_DEPARTMENT_OTHER): Payer: Self-pay | Admitting: Family Medicine

## 2024-09-27 NOTE — Telephone Encounter (Signed)
 FYI Only or Action Required?: FYI only for provider: appointment scheduled on 09/28/2024.  Patient was last seen in primary care on 07/29/2024 by Knute Thersia Bitters, FNP.  Called Nurse Triage reporting Anxiety.  Symptoms began several weeks ago.  Triage Disposition: See PCP When Office is Open (Within 3 Days)  Patient/caregiver understands and will follow disposition?: Yes              Copied from CRM #[id number]. Topic: Clinical - Red Word Triage >> Sep 27, 2024  2:21 PM Fonda T wrote: [facility] that prompted transfer to Nurse Triage: Pt calling, states she is having increased anxiety, along with rapid heart rate.  Requesting an appt for evaluation as soon as possible.  Ph. [phone] >> Sep 27, 2024  2:38 PM Fonda T wrote: Pt holding for NT, unable to hold any longer, call disconnected, requesting a return call.  Ph.  [phone] Reason for Disposition  MODERATE anxiety (e.g., persistent or frequent anxiety symptoms; interferes with sleep, school, or work)  Answer Assessment - Initial Assessment Questions Anxiety with increased heart rate the heart rate goes along with the anxiety Onset: about 3 weeks 4-8/10 anxiety level Current HR 96, resting in 80s Difficulty sleeping Pt states she is going through personal stressors Denies difficulty breathing, dizziness, lightheadedness, weakness/numbness, vision changes Denies SI/HI  Protocols used: Anxiety and Panic Attack-A-AH

## 2024-09-28 ENCOUNTER — Ambulatory Visit: Admitting: Family Medicine

## 2024-09-28 ENCOUNTER — Encounter (HOSPITAL_BASED_OUTPATIENT_CLINIC_OR_DEPARTMENT_OTHER): Payer: Self-pay

## 2024-09-29 ENCOUNTER — Ambulatory Visit: Admitting: Internal Medicine

## 2024-09-29 ENCOUNTER — Encounter (HOSPITAL_COMMUNITY): Payer: Self-pay

## 2024-09-29 ENCOUNTER — Other Ambulatory Visit: Payer: Self-pay

## 2024-09-29 ENCOUNTER — Encounter: Payer: Self-pay | Admitting: Internal Medicine

## 2024-09-29 ENCOUNTER — Other Ambulatory Visit (HOSPITAL_COMMUNITY): Payer: Self-pay

## 2024-09-29 ENCOUNTER — Other Ambulatory Visit (HOSPITAL_BASED_OUTPATIENT_CLINIC_OR_DEPARTMENT_OTHER): Payer: Self-pay | Admitting: Family Medicine

## 2024-09-29 VITALS — BP 136/80 | HR 75 | Temp 98.5°F | Ht 66.0 in | Wt 166.0 lb

## 2024-09-29 DIAGNOSIS — F5104 Psychophysiologic insomnia: Secondary | ICD-10-CM | POA: Diagnosis not present

## 2024-09-29 DIAGNOSIS — G4700 Insomnia, unspecified: Secondary | ICD-10-CM | POA: Insufficient documentation

## 2024-09-29 DIAGNOSIS — F419 Anxiety disorder, unspecified: Secondary | ICD-10-CM | POA: Diagnosis not present

## 2024-09-29 DIAGNOSIS — R7303 Prediabetes: Secondary | ICD-10-CM | POA: Diagnosis not present

## 2024-09-29 DIAGNOSIS — C50919 Malignant neoplasm of unspecified site of unspecified female breast: Secondary | ICD-10-CM | POA: Insufficient documentation

## 2024-09-29 MED ORDER — HYDROXYZINE HCL 10 MG PO TABS: 10.0000 mg | ORAL_TABLET | Freq: Three times a day (TID) | ORAL | 2 refills | Status: AC | PRN

## 2024-09-29 MED ORDER — TRAZODONE HCL 50 MG PO TABS: 25.0000 mg | ORAL_TABLET | Freq: Every evening | ORAL | 1 refills | Status: AC | PRN

## 2024-09-29 MED ORDER — IPRATROPIUM BROMIDE 0.06 % NA SOLN
1.0000 | Freq: Two times a day (BID) | NASAL | 1 refills | Status: DC
  Filled 2024-10-25: qty 15, 75d supply, fill #0

## 2024-09-29 NOTE — Assessment & Plan Note (Signed)
 Mild to mod, for trazodone 25- 50 mg at bedtime prn,  to f/u any worsening symptoms or concerns

## 2024-09-29 NOTE — Progress Notes (Signed)
 Patient ID: Denise Smith, female   DOB: [birth year], 58 y.o.   MRN: [id number]        Chief Complaint: follow up anxiety, insomnia       HPI:  Denise Smith is a 58 y.o. female here after recent separation from husband, they are trying to reconcile, but  tearful and nervous today.  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  Also with worsening insomnia, mostly difficult to get to sleep.  Pt denies chest pain, increased sob or doe, wheezing, orthopnea, PND, increased LE swelling, palpitations, dizziness or syncope.   Pt denies polydipsia, polyuria, or new focal neuro s/s.           Wt Readings from Last 3 Encounters:  09/29/24 166 lb (75.3 kg)  07/29/24 170 lb (77.1 kg)  07/20/24 173 lb (78.5 kg)   BP Readings from Last 3 Encounters:  09/29/24 136/80  08/18/24 128/83  07/29/24 127/81         Past Medical History:  Diagnosis Date   Anemia    Arthritis    Cataract 2019   Family history of bladder cancer    Family history of breast cancer    Family history of kidney cancer    Family history of leukemia    Family history of skin cancer    Headache(784.0)    Heart murmur 2009   Past Surgical History:  Procedure Laterality Date   ABDOMINAL HYSTERECTOMY N/A 05/06/2013   Procedure: TOTAL ABDOMINAL HYSTERECTOMY;  Surgeon: Charlie CHRISTELLA Croak, MD;  Location: [facility];  Service: Gynecology;  Laterality: N/A;   COLON RESECTION  1997   COLON SURGERY  1996   LYSIS OF ADHESION N/A 05/06/2013   Procedure: LYSIS OF ADHESION;  Surgeon: Charlie CHRISTELLA Croak, MD;  Location: [facility];  Service: Gynecology;  Laterality: N/A;   MOUTH SURGERY     TOTAL ABDOMINAL HYSTERECTOMY      reports that she quit smoking about 16 years ago. Her smoking use included cigarettes. She has been exposed to tobacco smoke. She has never used smokeless tobacco. She reports that she does  not drink alcohol and does not use drugs. family history includes Anemia in her sister; Arrhythmia in her father; Arthritis in her mother; Bladder Cancer (age of onset: 67) in her father; Breast cancer in her sister and other family members; Breast cancer (age of onset: 69) in her mother; Cancer in her father; Heart attack in her paternal grandmother; Heart disease in her paternal grandmother; Hypertension in her father and mother; Kidney cancer in her father; Leukemia in her maternal grandfather; Skin cancer in her maternal grandfather and mother. Allergies[1] Medications Ordered Prior to Encounter[2]      ROS:  All others reviewed and negative.  Objective        PE:  BP 136/80 (BP Location: Right Arm, Patient Position: Sitting, Cuff Size: Normal)   Pulse 75   Temp 98.5 F (36.9 C) (Oral)   Ht 5' 6 (1.676 m)   Wt 166 lb (75.3 kg)   LMP 04/21/2013   SpO2 99%   BMI 26.79 kg/m                 Constitutional: Pt appears tearful, nervous at times               HENT: Head: NCAT.  Right Ear: External ear normal.                 Left Ear: External ear normal.                Eyes: . Pupils are equal, round, and reactive to light. Conjunctivae and EOM are normal               Nose: without d/c or deformity               Neck: Neck supple. Gross normal ROM               Cardiovascular: Normal rate and regular rhythm.                 Pulmonary/Chest: Effort normal and breath sounds without rales or wheezing.                               Neurological: Pt is alert. At baseline orientation, motor grossly intact               Skin: Skin is warm. No rashes, no other new lesions, LE edema - none               Psychiatric: Pt behavior is normal without agitation  Micro: none  Cardiac tracings I have personally interpreted today:  none  Pertinent Radiological findings (summarize): none   Lab Results  Component Value Date   WBC 5.0 12/10/2023   HGB 13.8 12/10/2023   HCT 41.2  12/10/2023   PLT 383 12/10/2023   GLUCOSE 82 06/03/2024   CHOL 146 07/26/2024   TRIG 67 07/26/2024   HDL 50 07/26/2024   LDLCALC 83 07/26/2024   ALT 15 07/26/2024   AST 19 07/26/2024   NA 139 06/03/2024   K 4.2 06/03/2024   CL 101 06/03/2024   CREATININE 0.75 06/03/2024   BUN 11 06/03/2024   CO2 22 06/03/2024   TSH 1.940 12/10/2023   HGBA1C 5.5 07/29/2024   HGBA1C 5.5 07/29/2024   Assessment/Plan:  Denise Smith is a 58 y.o. White or Caucasian [1] female with  has a past medical history of Anemia, Arthritis, Cataract (2019), Family history of bladder cancer, Family history of breast cancer, Family history of kidney cancer, Family history of leukemia, Family history of skin cancer, Headache(784.0), and Heart murmur (2009).  Prediabetes Lab Results  Component Value Date   HGBA1C 5.5 07/29/2024   HGBA1C 5.5 07/29/2024   Stable, pt to continue current medical treatment  - diet,wt control   Anxiety Mild to mod, for atarax  10 mg tid prn, f/u counseling jan 6 as planned,  to f/u any worsening symptoms or concerns  Insomnia Mild to mod, for trazodone  25-50 mg at bedtime prn,  to f/u any worsening symptoms or concerns  Followup: Return if symptoms worsen or fail to improve.  Lynwood Rush, MD 09/29/2024 6:22 PM [facility]     [1] No Known Allergies [2]  Current Outpatient Medications on File Prior to Visit  Medication Sig Dispense Refill   betamethasone  dipropionate 0.05 % cream Apply a thin layer to affected area 2 times a day as needed for itching 60 g 0   Coenzyme Q10 (COQ10) 200 MG CAPS Take 200 mg by mouth daily.     Dupilumab  (DUPIXENT ) 300 MG/2ML SOAJ Inject 300 mg into the skin every 14 (  fourteen) days. Starting at day 15 for maintenance. 4 mL 11   estradiol  (ESTRACE ) 0.01 % CREA vaginal cream Place 1 gram vaginally 2 (two) times a week as directed. 42.5 g 3   estradiol  (VIVELLE -DOT) 0.0375  MG/24HR Place 1 patch onto the skin 2 (two) times a week. 24 patch 3   hydrocortisone  2.5 % ointment Apply topically 2 (two) times daily. Apply to affected areas of face twice daily until clear 30 g 1   nitroGLYCERIN  (NITROSTAT ) 0.4 MG SL tablet Place 1 tablet (0.4 mg total) under the tongue every 5 (five) minutes as needed for chest pain. max 3 tablets in 15 minutes. if no relief call 911 50 tablet 3   rosuvastatin  (CRESTOR ) 5 MG tablet Take 1 tablet (5 mg total) by mouth daily. 90 tablet 3   Testosterone  20 % CREA      No current facility-administered medications on file prior to visit.

## 2024-09-29 NOTE — Patient Instructions (Signed)
 Please take all new medication as prescribed - the hydroxyzine  as needed for nerves  Please take all new medication as prescribed - the trazodone  as needed for sleep  Please continue all other medications as before, and refills have been done if requested.  Please have the pharmacy call with any other refills you may need.  Please keep your appointments with your specialists as you may have planned - therapy Jan 6  We can hold on lab testing today

## 2024-09-29 NOTE — Progress Notes (Signed)
 Specialty Pharmacy Refill Coordination Note  Denise Smith is a 58 y.o. female contacted today regarding refills of specialty medication(s) Dupilumab  (Dupixent )   Patient requested (Patient-Rptd) Delivery   Delivery date: 10/01/24   Verified address: (Patient-Rptd) [address] [place]   Medication will be filled on: 09/30/24

## 2024-09-29 NOTE — Assessment & Plan Note (Signed)
 Lab Results  Component Value Date   HGBA1C 5.5 07/29/2024   HGBA1C 5.5 07/29/2024   Stable, pt to continue current medical treatment  - diet,wt control

## 2024-09-29 NOTE — Assessment & Plan Note (Signed)
 Mild to mod, for atarax  10 mg tid prn, f/u counseling jan 6 as planned,  to f/u any worsening symptoms or concerns

## 2024-10-01 ENCOUNTER — Other Ambulatory Visit: Payer: Self-pay

## 2024-10-01 ENCOUNTER — Encounter: Payer: Self-pay | Admitting: Internal Medicine

## 2024-10-11 ENCOUNTER — Encounter (HOSPITAL_BASED_OUTPATIENT_CLINIC_OR_DEPARTMENT_OTHER): Payer: Self-pay | Admitting: Family Medicine

## 2024-10-12 ENCOUNTER — Other Ambulatory Visit: Payer: Self-pay | Admitting: *Deleted

## 2024-10-12 DIAGNOSIS — E785 Hyperlipidemia, unspecified: Secondary | ICD-10-CM

## 2024-10-12 DIAGNOSIS — Z79899 Other long term (current) drug therapy: Secondary | ICD-10-CM

## 2024-10-13 ENCOUNTER — Other Ambulatory Visit: Payer: Self-pay

## 2024-10-15 ENCOUNTER — Other Ambulatory Visit (HOSPITAL_COMMUNITY): Payer: Self-pay

## 2024-10-18 ENCOUNTER — Telehealth: Payer: Self-pay

## 2024-10-18 ENCOUNTER — Telehealth (HOSPITAL_BASED_OUTPATIENT_CLINIC_OR_DEPARTMENT_OTHER): Payer: Self-pay | Admitting: Family Medicine

## 2024-10-18 NOTE — Telephone Encounter (Signed)
 Copied from CRM [id number]. Topic: General - Other >> Oct 18, 2024 11:22 AM Revonda D wrote: Reason for CRM: Pt stated that she received short-term disability paperwork from [facility] [place] and needs to have this filled out by Dr.John since she seen him on 12/17 for anxiety and had medications prescribed by him. Pt stated that she will have her primary office fax over the forms today and would like a callback with an update.

## 2024-10-18 NOTE — Telephone Encounter (Signed)
 Primary Information  Source  Cisar, Denise Smith (Patient)   Subject  Mirsky, Zeyna Smith (Patient)   Topic  General - Other    Communication  Reason for CRM: patient called to see if someone could call her back with an updated on her short term disability paperwork. Please f/u with patient

## 2024-10-18 NOTE — Telephone Encounter (Signed)
 Please refer to mychart message sent to pt 12/29. Pt was told she needed to reach out to Dr. Norleen who she saw for her visit and prescribed medication to help with anxiety.

## 2024-10-18 NOTE — Telephone Encounter (Signed)
Will be on the lookout for the forms.

## 2024-10-20 NOTE — Telephone Encounter (Unsigned)
 Copied from CRM [id number]. Topic: General - Other >> Oct 18, 2024 11:22 AM Revonda D wrote: Reason for CRM: Pt stated that she received short-term disability paperwork from [facility] and needs to have this filled out by Dr.John since she seen him on 12/17 for anxiety and had medications prescribed by him. Pt stated that she will have her primary office fax over the forms today and would like a callback with an update. >> Oct 19, 2024  4:52 PM Hadassah PARAS wrote: Ethelene from [employer] is following up on this request. Please notify if fax has been received on #[phone] ext [phone]

## 2024-10-21 ENCOUNTER — Other Ambulatory Visit: Payer: Self-pay

## 2024-10-21 LAB — HEPATIC FUNCTION PANEL
ALT: 16 IU/L (ref 0–32)
AST: 20 IU/L (ref 0–40)
Albumin: 4.3 g/dL (ref 3.8–4.9)
Alkaline Phosphatase: 59 IU/L (ref 49–135)
Bilirubin Total: 0.4 mg/dL (ref 0.0–1.2)
Bilirubin, Direct: 0.15 mg/dL (ref 0.00–0.40)
Total Protein: 6.5 g/dL (ref 6.0–8.5)

## 2024-10-21 LAB — LIPID PANEL
Chol/HDL Ratio: 2.7 ratio (ref 0.0–4.4)
Cholesterol, Total: 138 mg/dL (ref 100–199)
HDL: 51 mg/dL
LDL Chol Calc (NIH): 72 mg/dL (ref 0–99)
Triglycerides: 73 mg/dL (ref 0–149)
VLDL Cholesterol Cal: 15 mg/dL (ref 5–40)

## 2024-10-22 ENCOUNTER — Ambulatory Visit: Payer: Self-pay | Admitting: Internal Medicine

## 2024-10-22 DIAGNOSIS — Z79899 Other long term (current) drug therapy: Secondary | ICD-10-CM

## 2024-10-22 DIAGNOSIS — E785 Hyperlipidemia, unspecified: Secondary | ICD-10-CM

## 2024-10-25 ENCOUNTER — Other Ambulatory Visit: Payer: Self-pay

## 2024-10-25 ENCOUNTER — Other Ambulatory Visit (HOSPITAL_COMMUNITY): Payer: Self-pay

## 2024-10-25 NOTE — Progress Notes (Signed)
 Patient is aware of the $1750.00 copay and is unable to afford this copay. Patient will be disenrolled.

## 2024-10-26 ENCOUNTER — Ambulatory Visit: Admitting: Dermatology

## 2024-10-26 ENCOUNTER — Ambulatory Visit: Admitting: Mental Health

## 2024-10-28 ENCOUNTER — Ambulatory Visit (HOSPITAL_BASED_OUTPATIENT_CLINIC_OR_DEPARTMENT_OTHER): Admitting: Pulmonary Disease

## 2024-11-01 ENCOUNTER — Other Ambulatory Visit: Payer: Self-pay

## 2024-11-01 ENCOUNTER — Encounter (HOSPITAL_BASED_OUTPATIENT_CLINIC_OR_DEPARTMENT_OTHER): Payer: Self-pay | Admitting: Family Medicine

## 2024-11-01 ENCOUNTER — Other Ambulatory Visit (HOSPITAL_BASED_OUTPATIENT_CLINIC_OR_DEPARTMENT_OTHER): Payer: Self-pay | Admitting: Family Medicine

## 2024-11-01 MED ORDER — IPRATROPIUM BROMIDE 0.06 % NA SOLN: 1.0000 | Freq: Two times a day (BID) | NASAL | 1 refills | Status: DC

## 2024-11-02 ENCOUNTER — Encounter (HOSPITAL_COMMUNITY): Payer: Self-pay

## 2024-11-02 ENCOUNTER — Other Ambulatory Visit (HOSPITAL_COMMUNITY): Payer: Self-pay

## 2024-11-02 MED ORDER — IPRATROPIUM BROMIDE 0.06 % NA SOLN: 1.0000 | Freq: Two times a day (BID) | NASAL | 1 refills | Status: AC

## 2024-11-02 NOTE — Addendum Note (Signed)
 Addended by: RAYANN REXENE HERO on: 11/02/2024 02:45 PM   Modules accepted: Orders

## 2024-11-03 ENCOUNTER — Ambulatory Visit: Admitting: Mental Health

## 2024-11-03 ENCOUNTER — Other Ambulatory Visit (HOSPITAL_COMMUNITY): Payer: Self-pay

## 2024-11-05 ENCOUNTER — Ambulatory Visit: Admitting: Mental Health

## 2024-11-19 ENCOUNTER — Encounter (HOSPITAL_BASED_OUTPATIENT_CLINIC_OR_DEPARTMENT_OTHER): Payer: Self-pay | Admitting: Family Medicine

## 2024-11-25 ENCOUNTER — Ambulatory Visit (HOSPITAL_BASED_OUTPATIENT_CLINIC_OR_DEPARTMENT_OTHER): Admitting: Obstetrics and Gynecology

## 2024-12-10 ENCOUNTER — Ambulatory Visit (HOSPITAL_BASED_OUTPATIENT_CLINIC_OR_DEPARTMENT_OTHER): Payer: 59 | Admitting: Certified Nurse Midwife

## 2024-12-15 ENCOUNTER — Ambulatory Visit: Admitting: Dermatology

## 2025-01-31 ENCOUNTER — Encounter (HOSPITAL_BASED_OUTPATIENT_CLINIC_OR_DEPARTMENT_OTHER): Admitting: Family Medicine

## 2025-02-04 ENCOUNTER — Encounter (HOSPITAL_BASED_OUTPATIENT_CLINIC_OR_DEPARTMENT_OTHER): Admitting: Family Medicine
